# Patient Record
Sex: Female | Born: 1940 | Race: Black or African American | Hispanic: No | Smoking: Never smoker
Health system: Southern US, Community
[De-identification: ages and names within clinical notes are randomized; demographics above are authoritative.]

## PROBLEM LIST (undated history)

## (undated) DIAGNOSIS — E119 Type 2 diabetes mellitus without complications: Secondary | ICD-10-CM

## (undated) DIAGNOSIS — I10 Essential (primary) hypertension: Secondary | ICD-10-CM

## (undated) DIAGNOSIS — J189 Pneumonia, unspecified organism: Secondary | ICD-10-CM

## (undated) DIAGNOSIS — C541 Malignant neoplasm of endometrium: Secondary | ICD-10-CM

## (undated) DIAGNOSIS — Z923 Personal history of irradiation: Secondary | ICD-10-CM

## (undated) DIAGNOSIS — M1990 Unspecified osteoarthritis, unspecified site: Secondary | ICD-10-CM

## (undated) HISTORY — PX: APPENDECTOMY: SHX54

## (undated) HISTORY — PX: TUBAL LIGATION: SHX77

## (undated) HISTORY — DX: Malignant neoplasm of endometrium: C54.1

## (undated) HISTORY — DX: Essential (primary) hypertension: I10

## (undated) HISTORY — PX: ABDOMINAL HYSTERECTOMY: SHX81

## (undated) HISTORY — DX: Personal history of irradiation: Z92.3

## (undated) HISTORY — DX: Unspecified osteoarthritis, unspecified site: M19.90

## (undated) HISTORY — DX: Type 2 diabetes mellitus without complications: E11.9

---

## 2012-04-29 ENCOUNTER — Encounter (INDEPENDENT_AMBULATORY_CARE_PROVIDER_SITE_OTHER): Payer: Self-pay | Admitting: *Deleted

## 2012-04-30 ENCOUNTER — Encounter (INDEPENDENT_AMBULATORY_CARE_PROVIDER_SITE_OTHER): Payer: Self-pay

## 2013-10-27 ENCOUNTER — Encounter (INDEPENDENT_AMBULATORY_CARE_PROVIDER_SITE_OTHER): Payer: Self-pay | Admitting: *Deleted

## 2013-11-07 ENCOUNTER — Ambulatory Visit (INDEPENDENT_AMBULATORY_CARE_PROVIDER_SITE_OTHER): Payer: Self-pay | Admitting: Internal Medicine

## 2013-11-16 ENCOUNTER — Encounter (INDEPENDENT_AMBULATORY_CARE_PROVIDER_SITE_OTHER): Payer: Self-pay | Admitting: Internal Medicine

## 2013-11-16 ENCOUNTER — Ambulatory Visit (INDEPENDENT_AMBULATORY_CARE_PROVIDER_SITE_OTHER): Payer: PRIVATE HEALTH INSURANCE | Admitting: Internal Medicine

## 2013-11-16 ENCOUNTER — Other Ambulatory Visit (INDEPENDENT_AMBULATORY_CARE_PROVIDER_SITE_OTHER): Payer: Self-pay | Admitting: *Deleted

## 2013-11-16 ENCOUNTER — Telehealth (INDEPENDENT_AMBULATORY_CARE_PROVIDER_SITE_OTHER): Payer: Self-pay | Admitting: *Deleted

## 2013-11-16 VITALS — BP 120/58 | HR 72 | Temp 98.0°F | Ht 61.0 in | Wt 137.5 lb

## 2013-11-16 DIAGNOSIS — E119 Type 2 diabetes mellitus without complications: Secondary | ICD-10-CM

## 2013-11-16 DIAGNOSIS — R195 Other fecal abnormalities: Secondary | ICD-10-CM

## 2013-11-16 DIAGNOSIS — Z1211 Encounter for screening for malignant neoplasm of colon: Secondary | ICD-10-CM

## 2013-11-16 DIAGNOSIS — I10 Essential (primary) hypertension: Secondary | ICD-10-CM | POA: Insufficient documentation

## 2013-11-16 DIAGNOSIS — K921 Melena: Secondary | ICD-10-CM

## 2013-11-16 MED ORDER — PEG-KCL-NACL-NASULF-NA ASC-C 100 G PO SOLR: 1.0000 | Freq: Once | ORAL | Status: DC

## 2013-11-16 NOTE — Progress Notes (Signed)
Subjective:     Patient ID: Kristen Mccoy, female   DOB: 08-18-1941, 73 y.o.   MRN: [id number]  HPI Referred to our office for blood in stool. Patient denies seeing any blood in her stool.  10/12/2013 H and H 12.8 and 39.9, MCV 82, Platelet ct 240. Patient has never undergone a colonoscopy in the past.  Appetite is good. No weight loss. No dysphagia.  No abdominal pain. She usually has a BM daily. No melena or bright red rectal bleeding.   Review of Systems     Past Medical History  Diagnosis Date  . Diabetes     x 4-5 yrs  . Hypertension     x 5 yr   Past Medical History  Diagnosis Date  . Diabetes     x 4-5 yrs  . Hypertension     x 5 yr    History reviewed. No pertinent past surgical history.  Allergies  Allergen Reactions  . Aspirin   . Penicillins     No current outpatient prescriptions on file prior to visit.   No current facility-administered medications on file prior to visit.   No current outpatient prescriptions on file prior to visit.   No current facility-administered medications on file prior to visit.     Current outpatient prescriptions:amLODipine (NORVASC) 5 MG tablet, Take 5 mg by mouth daily., Disp: , Rfl: ;  lisinopril (PRINIVIL,ZESTRIL) 40 MG tablet, Take 40 mg by mouth daily., Disp: , Rfl: ;  metFORMIN (GLUCOPHAGE) 1000 MG tablet, Take 1,000 mg by mouth 2 (two) times daily with a meal., Disp: , Rfl: ;  vitamin B-12 (CYANOCOBALAMIN) 100 MCG tablet, Take 100 mcg by mouth daily., Disp: , Rfl:  Vitamin D, Ergocalciferol, (DRISDOL) 50000 UNITS CAPS capsule, Take 50,000 Units by mouth every 7 (seven) days., Disp: , Rfl:    History reviewed. No pertinent past surgical history.  Allergies  Allergen Reactions  . Aspirin   . Penicillins     No current outpatient prescriptions on file prior to visit.   No current facility-administered medications on file prior to visit.     Objective:   Physical Exam  Filed Vitals:   11/16/13 1053   BP: 120/58  Pulse: 72  Temp: 98 F (36.7 C)  Height: 5\' 1"  (1.549 m)  Weight: 137 lb 8 oz (62.37 kg)   Alert and oriented. Skin warm and dry. Oral mucosa is moist.   . Sclera anicteric, conjunctivae is pink. Thyroid not enlarged. No cervical lymphadenopathy. Lungs clear. Heart regular rate and rhythm.  Abdomen is soft. Bowel sounds are positive. No hepatomegaly. No abdominal masses felt. No tenderness.  No edema to lower extremities.       Assessment:    Guaiac postive stool. Colonic neoplasm needs to be ruled out. Has never undergone a colonoscopy in the past.    Plan:     Colonoscopy with Dr. Laural Golden.

## 2013-11-16 NOTE — Patient Instructions (Signed)
Colonoscopy with Dr. Rehman. The risks and benefits such as perforation, bleeding, and infection were reviewed with the patient and is agreeable. 

## 2013-11-16 NOTE — Telephone Encounter (Signed)
Patient needs movi prep 

## 2013-11-22 ENCOUNTER — Other Ambulatory Visit: Payer: Self-pay | Admitting: Internal Medicine

## 2013-11-22 DIAGNOSIS — R921 Mammographic calcification found on diagnostic imaging of breast: Secondary | ICD-10-CM

## 2013-11-28 ENCOUNTER — Encounter (HOSPITAL_COMMUNITY): Payer: Self-pay | Admitting: Pharmacy Technician

## 2013-11-30 ENCOUNTER — Other Ambulatory Visit (HOSPITAL_COMMUNITY): Payer: Self-pay | Admitting: Diagnostic Radiology

## 2013-11-30 ENCOUNTER — Ambulatory Visit
Admission: RE | Admit: 2013-11-30 | Discharge: 2013-11-30 | Disposition: A | Discharge status: Home / Self Care | Payer: PRIVATE HEALTH INSURANCE | Source: Ambulatory Visit | Attending: Internal Medicine | Admitting: Internal Medicine

## 2013-11-30 DIAGNOSIS — R921 Mammographic calcification found on diagnostic imaging of breast: Secondary | ICD-10-CM

## 2013-11-30 HISTORY — PX: BREAST BIOPSY: SHX20

## 2013-12-14 ENCOUNTER — Encounter (HOSPITAL_COMMUNITY): Payer: Self-pay | Admitting: *Deleted

## 2013-12-14 ENCOUNTER — Ambulatory Visit (HOSPITAL_COMMUNITY)
Admission: RE | Admit: 2013-12-14 | Discharge: 2013-12-14 | Disposition: A | Discharge status: Home / Self Care | Payer: PRIVATE HEALTH INSURANCE | Source: Ambulatory Visit | Attending: Internal Medicine | Admitting: Internal Medicine

## 2013-12-14 ENCOUNTER — Encounter (HOSPITAL_COMMUNITY)
Admission: RE | Disposition: A | Discharge status: Home / Self Care | Payer: Self-pay | Source: Ambulatory Visit | Attending: Internal Medicine

## 2013-12-14 DIAGNOSIS — R195 Other fecal abnormalities: Secondary | ICD-10-CM

## 2013-12-14 DIAGNOSIS — E119 Type 2 diabetes mellitus without complications: Secondary | ICD-10-CM | POA: Insufficient documentation

## 2013-12-14 DIAGNOSIS — K921 Melena: Secondary | ICD-10-CM | POA: Insufficient documentation

## 2013-12-14 DIAGNOSIS — D126 Benign neoplasm of colon, unspecified: Secondary | ICD-10-CM | POA: Insufficient documentation

## 2013-12-14 DIAGNOSIS — K644 Residual hemorrhoidal skin tags: Secondary | ICD-10-CM

## 2013-12-14 DIAGNOSIS — Z01812 Encounter for preprocedural laboratory examination: Secondary | ICD-10-CM | POA: Insufficient documentation

## 2013-12-14 DIAGNOSIS — I10 Essential (primary) hypertension: Secondary | ICD-10-CM | POA: Insufficient documentation

## 2013-12-14 DIAGNOSIS — Z79899 Other long term (current) drug therapy: Secondary | ICD-10-CM | POA: Insufficient documentation

## 2013-12-14 HISTORY — PX: COLONOSCOPY: SHX5424

## 2013-12-14 LAB — GLUCOSE, CAPILLARY: Glucose-Capillary: 103 mg/dL — ABNORMAL HIGH (ref 70–99)

## 2013-12-14 SURGERY — COLONOSCOPY
Anesthesia: Moderate Sedation

## 2013-12-14 MED ORDER — MEPERIDINE HCL 50 MG/ML IJ SOLN
INTRAMUSCULAR | Status: DC | PRN
  Administered 2013-12-14 (×2): 25 mg

## 2013-12-14 MED ORDER — STERILE WATER FOR IRRIGATION IR SOLN
Status: DC | PRN
  Administered 2013-12-14: 13:00:00

## 2013-12-14 MED ORDER — SODIUM CHLORIDE 0.9 % IV SOLN
INTRAVENOUS | Status: DC
  Administered 2013-12-14: 12:00:00 via INTRAVENOUS

## 2013-12-14 MED ORDER — MEPERIDINE HCL 50 MG/ML IJ SOLN
INTRAMUSCULAR | Status: AC
  Filled 2013-12-14: qty 1

## 2013-12-14 MED ORDER — MIDAZOLAM HCL 5 MG/5ML IJ SOLN
INTRAMUSCULAR | Status: DC | PRN
  Administered 2013-12-14: 2 mg via INTRAVENOUS
  Administered 2013-12-14: 1 mg via INTRAVENOUS
  Administered 2013-12-14: 2 mg via INTRAVENOUS

## 2013-12-14 MED ORDER — MIDAZOLAM HCL 5 MG/5ML IJ SOLN
INTRAMUSCULAR | Status: AC
  Filled 2013-12-14: qty 10

## 2013-12-14 NOTE — H&P (Addendum)
Kristen Mccoy is an 73 y.o. female.   Chief Complaint: Patient is here for colonoscopy. HPI: Patient is 73 year old African female who is undergoing diagnostic colonoscopy. She was found heme positive stool on routine testing. She denies abdominal pain change in her bowel habits melena or rectal bleeding. She does not take NSAIDs. She has good appetite her weight is stable. She did have CBC and her H&H was normal. Family history is negative for CRC.  Past Medical History  Diagnosis Date  . Diabetes     x 4-5 yrs  . Hypertension     x 5 yr    Past Surgical History  Procedure Laterality Date  . Appendectomy      Family History  Problem Relation Age of Onset  . Colon cancer Neg Hx    Social History:  reports that she has never smoked. She does not have any smokeless tobacco history on file. She reports that she does not drink alcohol or use illicit drugs.  Allergies:  Allergies  Allergen Reactions  . Aspirin   . Penicillins     Medications Prior to Admission  Medication Sig Dispense Refill  . amLODipine (NORVASC) 5 MG tablet Take 5 mg by mouth daily.      Marland Kitchen lisinopril (PRINIVIL,ZESTRIL) 40 MG tablet Take 40 mg by mouth daily.      . metFORMIN (GLUCOPHAGE) 1000 MG tablet Take 1,000 mg by mouth 2 (two) times daily with a meal.      . peg 3350 powder (MOVIPREP) 100 G SOLR Take 1 kit (200 g total) by mouth once.  1 kit  0  . vitamin B-12 (CYANOCOBALAMIN) 100 MCG tablet Take 100 mcg by mouth daily.      . Vitamin D, Ergocalciferol, (DRISDOL) 50000 UNITS CAPS capsule Take 50,000 Units by mouth every 7 (seven) days.        Results for orders placed during the hospital encounter of 12/14/13 (from the past 48 hour(s))  GLUCOSE, CAPILLARY     Status: Abnormal   Collection Time    12/14/13 12:08 PM      Result Value Ref Range   Glucose-Capillary 103 (*) 70 - 99 mg/dL   Comment 1 Documented in Chart     No results found.  ROS  Blood pressure 164/78, pulse 99, temperature  97.4 F (36.3 C), temperature source Oral, resp. rate 20, height _0  (1.549 m), weight 134 lb (60.782 kg), SpO2 100.00%. Physical Exam  Constitutional: She appears well-developed and well-nourished.  HENT:  Mouth/Throat: Oropharynx is clear and moist.  Eyes: Conjunctivae are normal. No scleral icterus.  Neck: No thyromegaly present.  Cardiovascular: Normal rate and regular rhythm.   Murmur (faint SEM at [facility].) heard. Respiratory: Effort normal and breath sounds normal.  GI: Soft. Bowel sounds are normal. She exhibits no distension and no mass. There is no tenderness.  Musculoskeletal: She exhibits no edema.  Lymphadenopathy:    She has no cervical adenopathy.  Neurological: She is alert.  Skin: Skin is warm and dry.     Assessment/Plan Heme positive stool. Diagnostic colonoscopy.  Wealthy Danielski U 12/14/2013, 12:57 PM

## 2013-12-14 NOTE — Discharge Instructions (Signed)
No aspirin or NSAIDs for 1 week. Resume usual diet and medications. No driving for 24 hours. Physician will contact you with biopsy results. Remember you cannot have an MRI until clip has passed Colonoscopy A colonoscopy is an exam to look at the entire large intestine (colon). This exam can help find problems such as tumors, polyps, inflammation, and areas of bleeding. The exam takes about 1 hour.  LET [facility] CARE PROVIDER KNOW ABOUT:   Any allergies you have.  All medicines you are taking, including vitamins, herbs, eye drops, creams, and over-the-counter medicines.  Previous problems you or members of your family have had with the use of anesthetics.  Any blood disorders you have.  Previous surgeries you have had.  Medical conditions you have. RISKS AND COMPLICATIONS  Generally, this is a safe procedure. However, as with any procedure, complications can occur. Possible complications include:  Bleeding.  Tearing or rupture of the colon wall.  Reaction to medicines given during the exam.  Infection (rare). BEFORE THE PROCEDURE   Ask your health care provider about changing or stopping your regular medicines.  You may be prescribed an oral bowel prep. This involves drinking a large amount of medicated liquid, starting the day before your procedure. The liquid will cause you to have multiple loose stools until your stool is almost clear or light green. This cleans out your colon in preparation for the procedure.  Do not eat or drink anything else once you have started the bowel prep, unless your health care provider tells you it is safe to do so.  Arrange for someone to drive you home after the procedure. PROCEDURE   You will be given medicine to help you relax (sedative).  You will lie on your side with your knees bent.  A long, flexible tube with a light and camera on the end (colonoscope) will be inserted through the rectum and into the colon. The camera sends  video back to a computer screen as it moves through the colon. The colonoscope also releases carbon dioxide gas to inflate the colon. This helps your health care provider see the area better.  During the exam, your health care provider may take a small tissue sample (biopsy) to be examined under a microscope if any abnormalities are found.  The exam is finished when the entire colon has been viewed. AFTER THE PROCEDURE   Do not drive for 24 hours after the exam.  You may have a small amount of blood in your stool.  You may pass moderate amounts of gas and have mild abdominal cramping or bloating. This is caused by the gas used to inflate your colon during the exam.  Ask when your test results will be ready and how you will get your results. Make sure you get your test results. Document Released: 09/05/2000 Document Revised: 06/29/2013 Document Reviewed: 05/16/2013 [facility] Patient Information 2014 [place]. Colon Polyps Polyps are lumps of extra tissue growing inside the body. Polyps can grow in the large intestine (colon). Most colon polyps are noncancerous (benign). However, some colon polyps can become cancerous over time. Polyps that are larger than a pea may be harmful. To be safe, caregivers remove and test all polyps. CAUSES  Polyps form when mutations in the genes cause your cells to grow and divide even though no more tissue is needed. RISK FACTORS There are a number of risk factors that can increase your chances of getting colon polyps. They include:  Being older than 8  years.  Family history of colon polyps or colon cancer.  Long-term colon diseases, such as colitis or Crohn disease.  Being overweight.  Smoking.  Being inactive.  Drinking too much alcohol. SYMPTOMS  Most small polyps do not cause symptoms. If symptoms are present, they may include:  Blood in the stool. The stool may look dark red or black.  Constipation or diarrhea that lasts longer than 1  week. DIAGNOSIS People often do not know they have polyps until their caregiver finds them during a regular checkup. Your caregiver can use 4 tests to check for polyps:  Digital rectal exam. The caregiver wears gloves and feels inside the rectum. This test would find polyps only in the rectum.  Barium enema. The caregiver puts a liquid called barium into your rectum before taking X-rays of your colon. Barium makes your colon look white. Polyps are dark, so they are easy to see in the X-ray pictures.  Sigmoidoscopy. A thin, flexible tube (sigmoidoscope) is placed into your rectum. The sigmoidoscope has a light and tiny camera in it. The caregiver uses the sigmoidoscope to look at the last third of your colon.  Colonoscopy. This test is like sigmoidoscopy, but the caregiver looks at the entire colon. This is the most common method for finding and removing polyps. TREATMENT  Any polyps will be removed during a sigmoidoscopy or colonoscopy. The polyps are then tested for cancer. PREVENTION  To help lower your risk of getting more colon polyps:  Eat plenty of fruits and vegetables. Avoid eating fatty foods.  Do not smoke.  Avoid drinking alcohol.  Exercise every day.  Lose weight if recommended by your caregiver.  Eat plenty of calcium and folate. Foods that are rich in calcium include milk, cheese, and broccoli. Foods that are rich in folate include chickpeas, kidney beans, and spinach. HOME CARE INSTRUCTIONS Keep all follow-up appointments as directed by your caregiver. You may need periodic exams to check for polyps. SEEK MEDICAL CARE IF: You notice bleeding during a bowel movement. Document Released: 06/04/2004 Document Revised: 12/01/2011 Document Reviewed: 11/18/2011 [facility] Patient Information 2014 [place].

## 2013-12-14 NOTE — Op Note (Addendum)
COLONOSCOPY PROCEDURE REPORT  PATIENT:  Kristen Mccoy  MR#:  [id number] Birthdate:  July 25, 1941, 73 y.o., female Endoscopist:  Dr. Rogene Houston, MD Referred By:  Dr. Monico Blitz, MD Procedure Date: 12/14/2013  Procedure:   Colonoscopy with snare polypectomy and application of single clip.  Indications:  Patient is 73 year old African female who is undergoing diagnostic colonoscopy because of heme positive stools. She has no GI symptoms. Her H&H is normal. She does not take NSAIDs and family history is negative for CRC.  Informed Consent:  The procedure and risks were reviewed with the patient and informed consent was obtained.  Medications:  Demerol 50 mg IV Versed 5 mg IV  Description of procedure:  After a digital rectal exam was performed, that colonoscope was advanced from the anus through the rectum and colon to the area of the cecum, ileocecal valve and appendiceal orifice. The cecum was deeply intubated. These structures were well-seen and photographed for the record. From the level of the cecum and ileocecal valve, the scope was slowly and cautiously withdrawn. The mucosal surfaces were carefully surveyed utilizing scope tip to flexion to facilitate fold flattening as needed. The scope was pulled down into the rectum where a thorough exam including retroflexion was performed.  Findings:   Prep satisfactory. 6 mm polyp snared from transverse colon. Bleeding from polypectomy site could not be controlled with coagulation and therefore single instinct clip applied with hemostasis. Mucosa rest of the colon and rectum was normal. Small hemorrhoids below the dentate line.   Therapeutic/Diagnostic Maneuvers Performed:  See above  Complications:  None  Cecal Withdrawal Time:  20 minutes  Impression:  Examination performed to cecum. 6 mm polyp snared from transverse colon. Single instinct clip applied to polypectomy site to control bleeding. External  hemorrhoids.  Recommendations:  Standard instructions given. Patient informed that she cannot have an MRI until clip has passed. I will contact patient with biopsy results and further recommendations.  Kristen Mccoy  12/14/2013 1:46 PM  CC: Dr. Monico Blitz, MD & Dr. Rayne Du ref. provider found

## 2013-12-20 ENCOUNTER — Encounter (HOSPITAL_COMMUNITY): Payer: Self-pay | Admitting: Internal Medicine

## 2013-12-26 ENCOUNTER — Encounter (INDEPENDENT_AMBULATORY_CARE_PROVIDER_SITE_OTHER): Payer: Self-pay | Admitting: *Deleted

## 2014-03-16 ENCOUNTER — Encounter (INDEPENDENT_AMBULATORY_CARE_PROVIDER_SITE_OTHER): Payer: Self-pay

## 2015-12-03 ENCOUNTER — Encounter (INDEPENDENT_AMBULATORY_CARE_PROVIDER_SITE_OTHER): Payer: Self-pay | Admitting: *Deleted

## 2015-12-04 ENCOUNTER — Ambulatory Visit (INDEPENDENT_AMBULATORY_CARE_PROVIDER_SITE_OTHER): Payer: Medicare Other | Admitting: Internal Medicine

## 2015-12-04 ENCOUNTER — Other Ambulatory Visit (INDEPENDENT_AMBULATORY_CARE_PROVIDER_SITE_OTHER): Payer: Self-pay | Admitting: Internal Medicine

## 2015-12-04 ENCOUNTER — Encounter (INDEPENDENT_AMBULATORY_CARE_PROVIDER_SITE_OTHER): Payer: Self-pay

## 2015-12-04 ENCOUNTER — Encounter (INDEPENDENT_AMBULATORY_CARE_PROVIDER_SITE_OTHER): Payer: Self-pay | Admitting: Internal Medicine

## 2015-12-04 ENCOUNTER — Encounter (INDEPENDENT_AMBULATORY_CARE_PROVIDER_SITE_OTHER): Payer: Self-pay | Admitting: *Deleted

## 2015-12-04 VITALS — BP 160/82 | HR 76 | Temp 98.0°F | Ht 61.0 in | Wt 131.7 lb

## 2015-12-04 DIAGNOSIS — K859 Acute pancreatitis without necrosis or infection, unspecified: Secondary | ICD-10-CM | POA: Diagnosis not present

## 2015-12-04 DIAGNOSIS — R1013 Epigastric pain: Secondary | ICD-10-CM

## 2015-12-04 DIAGNOSIS — R112 Nausea with vomiting, unspecified: Secondary | ICD-10-CM

## 2015-12-04 NOTE — Progress Notes (Addendum)
Subjective:    Patient ID: Kristen Mccoy, female    DOB: 1941/09/03, 75 y.o.   MRN: [id number]  HPI Referred by Dr Manuella Ghazi for epigastric pain and nausea and vomiting/possible Mallory Weiss tear.  She has been seen at [facility] ED about 2 months ago for same.  She tells me the nausea has continued.  She has not vomited since last week.  She says what she threw up was bitter. The vomiting usually occurs in the morning. She says she saw blood last week after vomiting. She says the vomiting occurs every other week and is always in the morning when she gets up after drinking water. She denies any acid reflux. She has been taking Protonix which helps with the vomiting. Her appetite is not good. She has lost 5 pounds since her last visit in 2015. She denies any abdominal pain.  She usually has a BM daily. She is avoiding greasy foods.  Greasy foods however do not bother her. No dysphagia.  No NSAIDs.    [facility] notes were received after patient was seen in the office) 10/28/2015 Upper abdominal pain, vomiting  2 days.  CT scan revealed  Inflammatory changes around the pancreatitic tail consistent with acute pancreatitis.  No pseudocyst formation.  10/28/2015 Glucose 177, Albumin 4.1, ALP 42, AST 13.6, total bili 0.3, ALT 17.  H and H 12.2 and 38.6, WBC 12.2, MCV 78.8    12/13/2013: Colonoscopy with snare polypectomy and application of single clip.  Indications: Patient is 75 year old African female who is undergoing diagnostic colonoscopy because of heme positive stools. She has no GI symptoms. Her H&H is normal. She does not take NSAIDs and family history is negative for CRC.  Impression:  Examination performed to cecum. 6 mm polyp snared from transverse colon. Single instinct clip applied to polypectomy site to control bleeding. External hemorrhoids.  Notes Recorded by Rogene Houston, MD on 12/25/2013 at 7:38 PM Patient had single small polyp snared from transverse colon and it is tubular  adenoma Results reviewed with patient   Review of Systems Past Medical History  Diagnosis Date  . Diabetes ([place])     x 4-5 yrs  . Hypertension     x 5 yr    Past Surgical History  Procedure Laterality Date  . Appendectomy    . Colonoscopy N/A 12/14/2013    Procedure: COLONOSCOPY;  Surgeon: Rogene Houston, MD;  Location: [facility];  Service: Endoscopy;  Laterality: N/A;  100    Allergies  Allergen Reactions  . Aspirin   . Penicillins     Current Outpatient Prescriptions on File Prior to Visit  Medication Sig Dispense Refill  . lisinopril (PRINIVIL,ZESTRIL) 40 MG tablet Take 40 mg by mouth daily.    . metFORMIN (GLUCOPHAGE) 1000 MG tablet Take 1,000 mg by mouth 2 (two) times daily with a meal.    . vitamin B-12 (CYANOCOBALAMIN) 100 MCG tablet Take 100 mcg by mouth daily.    . Vitamin D, Ergocalciferol, (DRISDOL) 50000 UNITS CAPS capsule Take 50,000 Units by mouth every 7 (seven) days.     No current facility-administered medications on file prior to visit.        Objective:   Physical Exam Blood pressure 160/82, pulse 76, temperature 98 F (36.7 C), height 5\' 1"  (1.549 m), weight 131 lb 11.2 oz (59.739 kg). Alert and oriented. Skin warm and dry. Oral mucosa is moist.   . Sclera anicteric, conjunctivae is pink. Thyroid not enlarged. No cervical  lymphadenopathy. Lungs clear. Heart regular rate and rhythm.  Abdomen is soft. Bowel sounds are positive. No hepatomegaly. No abdominal masses felt. No tenderness.  No edema to lower extremities.          Assessment & Plan:  Nausea and vomiting. Epigastric tenderness. PUD needs to be ruled out.  Continue the Protonix. EGD. Will need a follow CT to be sure her pancreatitis has resolved.

## 2015-12-04 NOTE — Patient Instructions (Addendum)
EGD. The risks and benefits such as perforation, bleeding, and infection were reviewed with the patient and is agreeable. Am going to order a CT abdomen/pelvis with CM for her recent diagnosis of pancreatitis.

## 2015-12-04 NOTE — Addendum Note (Signed)
Addended by: Butch Penny on: 12/04/2015 12:35 PM   Modules accepted: Orders

## 2015-12-11 ENCOUNTER — Ambulatory Visit (HOSPITAL_COMMUNITY)
Admission: RE | Admit: 2015-12-11 | Discharge: 2015-12-11 | Disposition: A | Discharge status: Home / Self Care | Payer: Medicare Other | Source: Ambulatory Visit | Attending: Internal Medicine | Admitting: Internal Medicine

## 2015-12-11 DIAGNOSIS — K859 Acute pancreatitis without necrosis or infection, unspecified: Secondary | ICD-10-CM | POA: Diagnosis present

## 2015-12-11 DIAGNOSIS — K760 Fatty (change of) liver, not elsewhere classified: Secondary | ICD-10-CM | POA: Diagnosis not present

## 2015-12-11 DIAGNOSIS — R1013 Epigastric pain: Secondary | ICD-10-CM | POA: Insufficient documentation

## 2015-12-11 DIAGNOSIS — R112 Nausea with vomiting, unspecified: Secondary | ICD-10-CM | POA: Diagnosis present

## 2015-12-11 DIAGNOSIS — Z87898 Personal history of other specified conditions: Secondary | ICD-10-CM | POA: Insufficient documentation

## 2015-12-11 LAB — POCT I-STAT CREATININE: Creatinine, Ser: 0.6 mg/dL (ref 0.44–1.00)

## 2015-12-11 MED ORDER — IOHEXOL 300 MG/ML  SOLN
100.0000 mL | Freq: Once | INTRAMUSCULAR | Status: AC | PRN
  Administered 2015-12-11: 100 mL via INTRAVENOUS

## 2015-12-20 ENCOUNTER — Telehealth (INDEPENDENT_AMBULATORY_CARE_PROVIDER_SITE_OTHER): Payer: Self-pay | Admitting: Internal Medicine

## 2015-12-20 NOTE — Telephone Encounter (Signed)
Ms. Kristen Mccoy called saying saying she was told by Dr. Laural Golden to call back in a few days with an update on her symptoms. She feels ok but when she eats she still feels nauseous everyday. It doesn't last long usually. She's no longer vomiting after eating.   Pt's ph# [phone] Thank you.

## 2015-12-21 NOTE — Telephone Encounter (Signed)
Progress report from Kristen Mccoy.

## 2015-12-25 NOTE — Telephone Encounter (Signed)
She was scheduled for EGD tomorrow. This message must be for Kristen Mccoy

## 2015-12-25 NOTE — Telephone Encounter (Signed)
noted 

## 2015-12-26 ENCOUNTER — Ambulatory Visit (HOSPITAL_COMMUNITY)
Admission: RE | Admit: 2015-12-26 | Discharge: 2015-12-26 | Disposition: A | Discharge status: Home / Self Care | Payer: Medicare Other | Source: Ambulatory Visit | Attending: Internal Medicine | Admitting: Internal Medicine

## 2015-12-26 ENCOUNTER — Encounter (HOSPITAL_COMMUNITY): Payer: Self-pay | Admitting: *Deleted

## 2015-12-26 ENCOUNTER — Encounter (HOSPITAL_COMMUNITY)
Admission: RE | Disposition: A | Discharge status: Home / Self Care | Payer: Self-pay | Source: Ambulatory Visit | Attending: Internal Medicine

## 2015-12-26 DIAGNOSIS — I10 Essential (primary) hypertension: Secondary | ICD-10-CM | POA: Diagnosis not present

## 2015-12-26 DIAGNOSIS — Z7984 Long term (current) use of oral hypoglycemic drugs: Secondary | ICD-10-CM | POA: Diagnosis not present

## 2015-12-26 DIAGNOSIS — K2970 Gastritis, unspecified, without bleeding: Secondary | ICD-10-CM | POA: Insufficient documentation

## 2015-12-26 DIAGNOSIS — K2960 Other gastritis without bleeding: Secondary | ICD-10-CM | POA: Diagnosis not present

## 2015-12-26 DIAGNOSIS — Z79899 Other long term (current) drug therapy: Secondary | ICD-10-CM | POA: Diagnosis not present

## 2015-12-26 DIAGNOSIS — E119 Type 2 diabetes mellitus without complications: Secondary | ICD-10-CM | POA: Insufficient documentation

## 2015-12-26 DIAGNOSIS — K319 Disease of stomach and duodenum, unspecified: Secondary | ICD-10-CM | POA: Diagnosis not present

## 2015-12-26 DIAGNOSIS — Z880 Allergy status to penicillin: Secondary | ICD-10-CM | POA: Insufficient documentation

## 2015-12-26 DIAGNOSIS — R1013 Epigastric pain: Secondary | ICD-10-CM | POA: Insufficient documentation

## 2015-12-26 DIAGNOSIS — K920 Hematemesis: Secondary | ICD-10-CM | POA: Insufficient documentation

## 2015-12-26 DIAGNOSIS — R112 Nausea with vomiting, unspecified: Secondary | ICD-10-CM

## 2015-12-26 HISTORY — PX: ESOPHAGOGASTRODUODENOSCOPY: SHX5428

## 2015-12-26 LAB — GLUCOSE, CAPILLARY
GLUCOSE-CAPILLARY: 79 mg/dL (ref 65–99)
GLUCOSE-CAPILLARY: 242 mg/dL — AB (ref 65–99)

## 2015-12-26 SURGERY — EGD (ESOPHAGOGASTRODUODENOSCOPY)
Anesthesia: Moderate Sedation

## 2015-12-26 MED ORDER — SODIUM CHLORIDE 0.9 % IV SOLN: INTRAVENOUS | Status: DC

## 2015-12-26 MED ORDER — BUTAMBEN-TETRACAINE-BENZOCAINE 2-2-14 % EX AERO
INHALATION_SPRAY | CUTANEOUS | Status: DC | PRN
  Administered 2015-12-26: 2 via TOPICAL

## 2015-12-26 MED ORDER — DEXTROSE-NACL 5-0.9 % IV SOLN
INTRAVENOUS | Status: DC
  Administered 2015-12-26: 13:00:00 via INTRAVENOUS

## 2015-12-26 MED ORDER — MIDAZOLAM HCL 5 MG/5ML IJ SOLN
INTRAMUSCULAR | Status: AC
  Filled 2015-12-26: qty 10

## 2015-12-26 MED ORDER — MIDAZOLAM HCL 5 MG/5ML IJ SOLN
INTRAMUSCULAR | Status: DC | PRN
  Administered 2015-12-26: 1 mg via INTRAVENOUS
  Administered 2015-12-26 (×2): 2 mg via INTRAVENOUS

## 2015-12-26 MED ORDER — STERILE WATER FOR IRRIGATION IR SOLN
Status: DC | PRN
  Administered 2015-12-26: 2.5 mL

## 2015-12-26 MED ORDER — MEPERIDINE HCL 50 MG/ML IJ SOLN
INTRAMUSCULAR | Status: DC | PRN
  Administered 2015-12-26 (×2): 25 mg via INTRAVENOUS

## 2015-12-26 MED ORDER — MEPERIDINE HCL 50 MG/ML IJ SOLN
INTRAMUSCULAR | Status: AC
  Filled 2015-12-26: qty 1

## 2015-12-26 NOTE — H&P (Signed)
Kristen Mccoy is an 75 y.o. female.   Chief Complaint: Patient is here for esophagogastroduodenoscopy. HPI: Patient is 75 year old Afro-American female who presents with few week history of intermittent nausea vomiting and epigastric pain. She also had single episode of hematemesis when she vomited small amount of bright red blood per rectum. She denies melena or rectal bleeding. She is on Protonix. She states she is feeling better and has an episode in 2 weeks. She has lost 5 pounds. She denies heartburn dysphagia. History is negative for peptic ulcer disease. She does not take OTC NSAIDs. She does not smoke cigarettes or drink alcohol.  Past Medical History  Diagnosis Date  . Diabetes ([place])     x 4-5 yrs  . Hypertension     x 5 yr    Past Surgical History  Procedure Laterality Date  . Appendectomy    . Colonoscopy N/A 12/14/2013    Procedure: COLONOSCOPY;  Surgeon: Rogene Houston, MD;  Location: [facility];  Service: Endoscopy;  Laterality: N/A;  100    Family History  Problem Relation Age of Onset  . Colon cancer Neg Hx    Social History:  reports that she has never smoked. She does not have any smokeless tobacco history on file. She reports that she does not drink alcohol or use illicit drugs.  Allergies:  Allergies  Allergen Reactions  . Penicillins Other (See Comments)    MOUTH WENT SIDEWAYS.  Marland Kitchen Aspirin Itching and Rash    Medications Prior to Admission  Medication Sig Dispense Refill  . atorvastatin (LIPITOR) 10 MG tablet Take 10 mg by mouth daily.    Marland Kitchen lisinopril (PRINIVIL,ZESTRIL) 20 MG tablet Take 20 mg by mouth daily.    . metFORMIN (GLUCOPHAGE) 1000 MG tablet Take 1,000 mg by mouth 2 (two) times daily with a meal.    . pantoprazole (PROTONIX) 40 MG tablet Take 40 mg by mouth daily.    . sitaGLIPtin (JANUVIA) 50 MG tablet Take 50 mg by mouth daily.    . vitamin B-12 (CYANOCOBALAMIN) 100 MCG tablet Take 100 mcg by mouth daily.    . Vitamin D, Ergocalciferol,  (DRISDOL) 50000 UNITS CAPS capsule Take 50,000 Units by mouth every 7 (seven) days. Takes on Thursdays.      Results for orders placed or performed during the hospital encounter of 12/26/15 (from the past 48 hour(s))  Glucose, capillary     Status: None   Collection Time: 12/26/15 12:56 PM  Result Value Ref Range   Glucose-Capillary 79 65 - 99 mg/dL   No results found.  ROS  Blood pressure 199/87, pulse 68, resp. rate 18, height 5\' 1"  (1.549 m), weight 143 lb (64.864 kg), SpO2 99 %. Physical Exam  Constitutional: She appears well-developed and well-nourished.  HENT:  Mouth/Throat: Oropharynx is clear and moist.  Eyes: Conjunctivae are normal. No scleral icterus.  Neck: No thyromegaly present.  Cardiovascular: Normal rate, regular rhythm and normal heart sounds.   No murmur heard. Respiratory: Effort normal and breath sounds normal.  GI: Soft. She exhibits no distension. There is tenderness (mild midepigastric tenderness).  Musculoskeletal: She exhibits no edema.  Lymphadenopathy:    She has no cervical adenopathy.  Neurological: She is alert.  Skin: Skin is warm and dry.     Assessment/Plan History of nausea vomiting and epigastric pain and single episode of hematemesis. Diagnostic esophagogastroduodenoscopy.  Rogene Houston, MD 12/26/2015, 1:59 PM

## 2015-12-26 NOTE — Discharge Instructions (Signed)
Resume usual medications and diet. °No driving for 24 hours. °Physician will call with biopsy results. ° °Gastrointestinal Endoscopy, Care After °Refer to this sheet in the next few weeks. These instructions provide you with information on caring for yourself after your procedure. Your caregiver may also give you more specific instructions. Your treatment has been planned according to current medical practices, but problems sometimes occur. Call your caregiver if you have any problems or questions after your procedure. °HOME CARE INSTRUCTIONS °· If you were given medicine to help you relax (sedative), do not drive, operate machinery, or sign important documents for 24 hours. °· Avoid alcohol and hot or warm beverages for the first 24 hours after the procedure. °· Only take over-the-counter or prescription medicines for pain, discomfort, or fever as directed by your caregiver. You may resume taking your normal medicines unless your caregiver tells you otherwise. Ask your caregiver when you may resume taking medicines that may cause bleeding, such as aspirin, clopidogrel, or warfarin. °· You may return to your normal diet and activities on the day after your procedure, or as directed by your caregiver. Walking may help to reduce any bloated feeling in your abdomen. °· Drink enough fluids to keep your urine clear or pale yellow. °· You may gargle with salt water if you have a sore throat. °SEEK IMMEDIATE MEDICAL CARE IF: °· You have severe nausea or vomiting. °· You have severe abdominal pain, abdominal cramps that last longer than 6 hours, or abdominal swelling (distention). °· You have severe shoulder or back pain. °· You have trouble swallowing. °· You have shortness of breath, your breathing is shallow, or you are breathing faster than normal. °· You have a fever or a rapid heartbeat. °· You vomit blood or material that looks like coffee grounds. °· You have bloody, black, or tarry stools. °MAKE SURE  YOU: °· Understand these instructions. °· Will watch your condition. °· Will get help right away if you are not doing well or get worse. °  °This information is not intended to replace advice given to you by your health care provider. Make sure you discuss any questions you have with your health care provider. °  °Document Released: 04/22/2004 Document Revised: 09/29/2014 Document Reviewed: 12/09/2011 °Elsevier Interactive Patient Education ©2016 [employer] ° °

## 2015-12-26 NOTE — Op Note (Signed)
[facility] Patient Name: Kristen Mccoy Procedure Date: 12/26/2015 1:37 PM MRN: [id number] Date of Birth: [birth year] Attending MD: Hildred Laser , MD CSN: [id number] Age: 75 Admit Type: Outpatient Procedure:                Upper GI endoscopy Indications:              Epigastric abdominal pain, Hematemesis, Nausea with                            vomiting Providers:                Hildred Laser, MD, Lurline Del, RN, Janeece Riggers, RN Referring MD:             Fuller Canada. Manuella Ghazi, MD (Referring MD) Medicines:                Cetacaine spray, Meperidine 50 mg IV, Midazolam 5                            mg IV Complications:            No immediate complications. Estimated Blood Loss:     Estimated blood loss was minimal. Procedure:                Pre-Anesthesia Assessment:                           - Prior to the procedure, a History and Physical                            was performed, and patient medications and                            allergies were reviewed. The patient's tolerance of                            previous anesthesia was also reviewed. The risks                            and benefits of the procedure and the sedation                            options and risks were discussed with the patient.                            All questions were answered, and informed consent                            was obtained. Prior Anticoagulants: The patient has                            taken no previous anticoagulant or antiplatelet                            agents. ASA Grade Assessment: II - A patient with  mild systemic disease. After reviewing the risks                            and benefits, the patient was deemed in                            satisfactory condition to undergo the procedure.                           After obtaining informed consent, the endoscope was                            passed under direct vision. Throughout the              procedure, the patient's blood pressure, pulse, and                            oxygen saturations were monitored continuously. The                            [id number]) was introduced through the                            mouth, and advanced to the second part of duodenum.                            The upper GI endoscopy was technically difficult                            and complex due to the patient's body habitus.                            Successful completion of the procedure was aided by                            withdrawing the scope and replacing with the                            pediatric endoscope. The patient tolerated the                            procedure well. Scope In: 2:18:48 PM Scope Out: 2:32:37 PM Total Procedure Duration: 0 hours 13 minutes 49 seconds  Findings:      The examined esophagus was normal.      The Z-line was regular and was found 36 cm from the incisors.      The cardia, gastric fundus, gastric body and incisura were normal.      Diffuse mild inflammation characterized by congestion (edema) and       erythema was found in the gastric antrum. Biopsies were taken with a       cold forceps for histology.      The duodenal bulb and second portion of the duodenum were normal.      The hypopharynx was normal. Impression:               -  Normal esophagus.                           - Z-line regular, 36 cm from the incisors.                           - Normal cardia, gastric fundus, gastric body and                            incisura.                           - Non-erosive gastritis at antrum. Biopsied.                           - Normal duodenal bulb and second portion of the                            duodenum.                           - Normal hypopharynx. Moderate Sedation:      Moderate (conscious) sedation was administered by the endoscopy nurse       and supervised by the endoscopist. The following parameters were        monitored: oxygen saturation, heart rate, blood pressure, CO2       capnography and response to care. Total physician intraservice time was       23 minutes. Recommendation:           - Patient has a contact number available for                            emergencies. The signs and symptoms of potential                            delayed complications were discussed with the                            patient. Return to normal activities tomorrow.                            Written discharge instructions were provided to the                            patient.                           - Resume previous diet today.                           - Continue present medications.                           - Await pathology results. Procedure Code(s):        --- Professional ---  [id number], Esophagogastroduodenoscopy, flexible,                            transoral; with biopsy, single or multiple                           99152, Moderate sedation services provided by the                            same physician or other qualified health care                            professional performing the diagnostic or                            therapeutic service that the sedation supports,                            requiring the presence of an independent trained                            observer to assist in the monitoring of the                            patient's level of consciousness and physiological                            status; initial 15 minutes of intraservice time,                            patient age 54 years or older                           [phone], Moderate sedation services; each additional                            15 minutes intraservice time Diagnosis Code(s):        --- Professional ---                           K29.60, Other gastritis without bleeding                           R10.13, Epigastric pain                           K92.0, Hematemesis                            R11.2, Nausea with vomiting, unspecified CPT copyright 2016 [employer]. All rights reserved. The codes documented in this report are preliminary and upon coder review may  be revised to meet current compliance requirements. Hildred Laser, MD Hildred Laser, MD 12/26/2015 2:43:27 PM This report has been signed electronically. Number of Addenda: 0

## 2016-01-01 ENCOUNTER — Encounter (HOSPITAL_COMMUNITY): Payer: Self-pay | Admitting: Internal Medicine

## 2018-02-15 ENCOUNTER — Encounter (HOSPITAL_COMMUNITY): Payer: Self-pay | Admitting: Emergency Medicine

## 2018-02-15 ENCOUNTER — Emergency Department (HOSPITAL_COMMUNITY): Payer: Medicare HMO

## 2018-02-15 ENCOUNTER — Other Ambulatory Visit: Payer: Self-pay

## 2018-02-15 ENCOUNTER — Emergency Department (HOSPITAL_COMMUNITY)
Admission: EM | Admit: 2018-02-15 | Discharge: 2018-02-15 | Disposition: A | Discharge status: Home / Self Care | Payer: Medicare HMO | Attending: Emergency Medicine | Admitting: Emergency Medicine

## 2018-02-15 DIAGNOSIS — I10 Essential (primary) hypertension: Secondary | ICD-10-CM | POA: Diagnosis not present

## 2018-02-15 DIAGNOSIS — R1084 Generalized abdominal pain: Secondary | ICD-10-CM

## 2018-02-15 DIAGNOSIS — Z7984 Long term (current) use of oral hypoglycemic drugs: Secondary | ICD-10-CM | POA: Insufficient documentation

## 2018-02-15 DIAGNOSIS — E119 Type 2 diabetes mellitus without complications: Secondary | ICD-10-CM | POA: Insufficient documentation

## 2018-02-15 DIAGNOSIS — Z79899 Other long term (current) drug therapy: Secondary | ICD-10-CM | POA: Insufficient documentation

## 2018-02-15 DIAGNOSIS — R109 Unspecified abdominal pain: Secondary | ICD-10-CM | POA: Diagnosis present

## 2018-02-15 DIAGNOSIS — N9489 Other specified conditions associated with female genital organs and menstrual cycle: Secondary | ICD-10-CM

## 2018-02-15 LAB — CBC WITH DIFFERENTIAL/PLATELET
BASOS PCT: 0 %
Basophils Absolute: 0 10*3/uL (ref 0.0–0.1)
Eosinophils Absolute: 0.2 10*3/uL (ref 0.0–0.7)
Eosinophils Relative: 2 %
HEMATOCRIT: 35.5 % — AB (ref 36.0–46.0)
Hemoglobin: 11.1 g/dL — ABNORMAL LOW (ref 12.0–15.0)
LYMPHS ABS: 1.6 10*3/uL (ref 0.7–4.0)
Lymphocytes Relative: 25 %
MCH: 24.6 pg — ABNORMAL LOW (ref 26.0–34.0)
MCHC: 31.3 g/dL (ref 30.0–36.0)
MCV: 78.7 fL (ref 78.0–100.0)
MONOS PCT: 7 %
Monocytes Absolute: 0.5 10*3/uL (ref 0.1–1.0)
NEUTROS ABS: 4.4 10*3/uL (ref 1.7–7.7)
Neutrophils Relative %: 66 %
Platelets: 243 10*3/uL (ref 150–400)
RBC: 4.51 MIL/uL (ref 3.87–5.11)
RDW: 15.9 % — ABNORMAL HIGH (ref 11.5–15.5)
WBC: 6.6 10*3/uL (ref 4.0–10.5)

## 2018-02-15 LAB — URINALYSIS, ROUTINE W REFLEX MICROSCOPIC
BILIRUBIN URINE: NEGATIVE
Glucose, UA: NEGATIVE mg/dL
Hgb urine dipstick: NEGATIVE
Ketones, ur: NEGATIVE mg/dL
Leukocytes, UA: NEGATIVE
NITRITE: NEGATIVE
PH: 7 (ref 5.0–8.0)
Protein, ur: NEGATIVE mg/dL
SPECIFIC GRAVITY, URINE: 1.014 (ref 1.005–1.030)

## 2018-02-15 LAB — COMPREHENSIVE METABOLIC PANEL
ALBUMIN: 3.6 g/dL (ref 3.5–5.0)
ALT: 21 U/L (ref 14–54)
AST: 28 U/L (ref 15–41)
Alkaline Phosphatase: 41 U/L (ref 38–126)
Anion gap: 11 (ref 5–15)
BILIRUBIN TOTAL: 0.5 mg/dL (ref 0.3–1.2)
BUN: 15 mg/dL (ref 6–20)
CHLORIDE: 102 mmol/L (ref 101–111)
CO2: 29 mmol/L (ref 22–32)
Calcium: 9.3 mg/dL (ref 8.9–10.3)
Creatinine, Ser: 0.82 mg/dL (ref 0.44–1.00)
GFR calc Af Amer: >60 mL/min (ref >60)
Glucose, Bld: 114 mg/dL — ABNORMAL HIGH (ref 65–99)
POTASSIUM: 4.2 mmol/L (ref 3.5–5.1)
SODIUM: 142 mmol/L (ref 135–145)
Total Protein: 7.3 g/dL (ref 6.5–8.1)

## 2018-02-15 LAB — LIPASE, BLOOD: Lipase: 38 U/L (ref 11–51)

## 2018-02-15 MED ORDER — DICYCLOMINE HCL 20 MG PO TABS: 20.0000 mg | ORAL_TABLET | Freq: Four times a day (QID) | ORAL | 0 refills | Status: DC | PRN

## 2018-02-15 MED ORDER — IOPAMIDOL (ISOVUE-300) INJECTION 61%
100.0000 mL | Freq: Once | INTRAVENOUS | Status: AC | PRN
  Administered 2018-02-15: 100 mL via INTRAVENOUS

## 2018-02-15 MED ORDER — DICYCLOMINE HCL 10 MG/ML IM SOLN
20.0000 mg | Freq: Once | INTRAMUSCULAR | Status: AC
  Administered 2018-02-15: 20 mg via INTRAMUSCULAR
  Filled 2018-02-15: qty 2

## 2018-02-15 NOTE — ED Notes (Signed)
Pt transported to CT ?

## 2018-02-15 NOTE — ED Provider Notes (Signed)
[facility] EMERGENCY DEPARTMENT Provider Note   CSN: [id number] Arrival date & time: 02/15/18  3474     History   Chief Complaint Chief Complaint  Patient presents with  . Abdominal Pain    HPI Kristen Mccoy is a 77 y.o. female.  HPI  Pt was seen at 1000.  Per pt, c/o gradual onset and persistence of constant generalized abd "pain" for the past 5 to 6 weeks.  Has been associated with no other symptoms.  Describes the abd pain as "cramping."  Denies N/V, no diarrhea, no fevers, no back pain, no rash, no CP/SOB, no black or blood in stools, no dysuria/hematuria. Pt was evaluated at OSH for these symptoms and "wasn't given an answer."       Past Medical History:  Diagnosis Date  . Diabetes ([place])    x 4-5 yrs  . Hypertension    x 5 yr    Patient Active Problem List   Diagnosis Date Noted  . Diabetes ([place]) 11/16/2013  . Essential hypertension, benign 11/16/2013  . Blood in stool 11/16/2013    Past Surgical History:  Procedure Laterality Date  . APPENDECTOMY    . COLONOSCOPY N/A 12/14/2013   Procedure: COLONOSCOPY;  Surgeon: Rogene Houston, MD;  Location: [facility] ENDO SUITE;  Service: Endoscopy;  Laterality: N/A;  100  . ESOPHAGOGASTRODUODENOSCOPY N/A 12/26/2015   Procedure: ESOPHAGOGASTRODUODENOSCOPY (EGD);  Surgeon: Rogene Houston, MD;  Location: [facility] ENDO SUITE;  Service: Endoscopy;  Laterality: N/A;  2:45     OB History   None      Home Medications    Prior to Admission medications   Medication Sig Start Date End Date Taking? Authorizing Provider  amLODipine (NORVASC) 5 MG tablet Take 5 mg by mouth daily.   Yes [provider]  atorvastatin (LIPITOR) 20 MG tablet Take 20 mg by mouth daily.   Yes [provider]  hydrochlorothiazide (HYDRODIURIL) 25 MG tablet Take 25 mg by mouth daily.   Yes [provider]  metFORMIN (GLUCOPHAGE) 500 MG tablet Take 1,000 mg by mouth 2 (two) times daily with a meal.   Yes [provider]    sitaGLIPtin (JANUVIA) 50 MG tablet Take 50 mg by mouth daily.   Yes [provider]  Vitamin D, Ergocalciferol, (DRISDOL) 50000 UNITS CAPS capsule Take 50,000 Units by mouth every 7 (seven) days. Takes on Thursdays.   Yes [provider]    Family History Family History  Problem Relation Age of Onset  . Colon cancer Neg Hx     Social History Social History   Tobacco Use  . Smoking status: Never Smoker  . Smokeless tobacco: Never Used  Substance Use Topics  . Alcohol use: No  . Drug use: No     Allergies   Penicillins and Aspirin   Review of Systems Review of Systems ROS: Statement: All systems negative except as marked or noted in the HPI; Constitutional: Negative for fever and chills. ; ; Eyes: Negative for eye pain, redness and discharge. ; ; ENMT: Negative for ear pain, hoarseness, nasal congestion, sinus pressure and sore throat. ; ; Cardiovascular: Negative for chest pain, palpitations, diaphoresis, dyspnea and peripheral edema. ; ; Respiratory: Negative for cough, wheezing and stridor. ; ; Gastrointestinal: +abd pain. Negative for nausea, vomiting, diarrhea, blood in stool, hematemesis, jaundice and rectal bleeding. . ; ; Genitourinary: Negative for dysuria, flank pain and hematuria. ; ; Musculoskeletal: Negative for back pain and neck pain. Negative for swelling  and trauma.; ; Skin: Negative for pruritus, rash, abrasions, blisters, bruising and skin lesion.; ; Neuro: Negative for headache, lightheadedness and neck stiffness. Negative for weakness, altered level of consciousness, altered mental status, extremity weakness, paresthesias, involuntary movement, seizure and syncope.       Physical Exam Updated Vital Signs BP (!) 182/72 Comment: checked twice for accuracy, pt reported that she had not took her blood pressure medication this morning.  Pulse (!) 107   Temp 98.1 F (36.7 C) (Oral)   Resp 16   Ht 5\' 1"  (1.549 m)   Wt 54.4 kg (120 lb)   SpO2  96%   BMI 22.67 kg/m   Physical Exam 1005: Physical examination:  Nursing notes reviewed; Vital signs and O2 SAT reviewed;  Constitutional: Well developed, Well nourished, Well hydrated, In no acute distress; Head:  Normocephalic, atraumatic; Eyes: EOMI, PERRL, No scleral icterus; ENMT: Mouth and pharynx normal, Mucous membranes moist; Neck: Supple, Full range of motion, No lymphadenopathy; Cardiovascular: Regular rate and rhythm, No gallop; Respiratory: Breath sounds clear & equal bilaterally, No wheezes.  Speaking full sentences with ease, Normal respiratory effort/excursion; Chest: Nontender, Movement normal; Abdomen: Soft, +mild diffuse tenderness to palp. No rebound or guarding. Nondistended, Normal bowel sounds; Genitourinary: No CVA tenderness; Extremities: Peripheral pulses normal, No tenderness, No edema, No calf edema or asymmetry.; Neuro: AA&Ox3, Major CN grossly intact.  Speech clear. No gross focal motor or sensory deficits in extremities.; Skin: Color normal, Warm, Dry.   ED Treatments / Results  Labs (all labs ordered are listed, but only abnormal results are displayed)   EKG None  Radiology   Procedures Procedures (including critical care time)  Medications Ordered in ED Medications  dicyclomine (BENTYL) injection 20 mg (20 mg Intramuscular Given 02/15/18 1032)     Initial Impression / Assessment and Plan / ED Course  I have reviewed the triage vital signs and the nursing notes.  Pertinent labs & imaging results that were available during my care of the patient were reviewed by me and considered in my medical decision making (see chart for details).  MDM Reviewed: previous chart, nursing note and vitals Reviewed previous: labs Interpretation: labs and CT scan   Results for orders placed or performed during the hospital encounter of 02/15/18  Comprehensive metabolic panel  Result Value Ref Range   Sodium 142 135 - 145 mmol/L   Potassium 4.2 3.5 - 5.1 mmol/L     Chloride 102 101 - 111 mmol/L   CO2 29 22 - 32 mmol/L   Glucose, Bld 114 (H) 65 - 99 mg/dL   BUN 15 6 - 20 mg/dL   Creatinine, Ser 0.82 0.44 - 1.00 mg/dL   Calcium 9.3 8.9 - 10.3 mg/dL   Total Protein 7.3 6.5 - 8.1 g/dL   Albumin 3.6 3.5 - 5.0 g/dL   AST 28 15 - 41 U/L   ALT 21 14 - 54 U/L   Alkaline Phosphatase 41 38 - 126 U/L   Total Bilirubin 0.5 0.3 - 1.2 mg/dL   GFR calc non Af Amer >60 >60 mL/min   GFR calc Af Amer >60 >60 mL/min   Anion gap 11 5 - 15  Lipase, blood  Result Value Ref Range   Lipase 38 11 - 51 U/L  CBC with Differential  Result Value Ref Range   WBC 6.6 4.0 - 10.5 K/uL   RBC 4.51 3.87 - 5.11 MIL/uL   Hemoglobin 11.1 (L) 12.0 - 15.0 g/dL   HCT 35.5 (L)  36.0 - 46.0 %   MCV 78.7 78.0 - 100.0 fL   MCH 24.6 (L) 26.0 - 34.0 pg   MCHC 31.3 30.0 - 36.0 g/dL   RDW 15.9 (H) 11.5 - 15.5 %   Platelets 243 150 - 400 K/uL   Neutrophils Relative % 66 %   Neutro Abs 4.4 1.7 - 7.7 K/uL   Lymphocytes Relative 25 %   Lymphs Abs 1.6 0.7 - 4.0 K/uL   Monocytes Relative 7 %   Monocytes Absolute 0.5 0.1 - 1.0 K/uL   Eosinophils Relative 2 %   Eosinophils Absolute 0.2 0.0 - 0.7 K/uL   Basophils Relative 0 %   Basophils Absolute 0.0 0.0 - 0.1 K/uL  Urinalysis, Routine w reflex microscopic  Result Value Ref Range   Color, Urine YELLOW YELLOW   APPearance CLEAR CLEAR   Specific Gravity, Urine 1.014 1.005 - 1.030   pH 7.0 5.0 - 8.0   Glucose, UA NEGATIVE NEGATIVE mg/dL   Hgb urine dipstick NEGATIVE NEGATIVE   Bilirubin Urine NEGATIVE NEGATIVE   Ketones, ur NEGATIVE NEGATIVE mg/dL   Protein, ur NEGATIVE NEGATIVE mg/dL   Nitrite NEGATIVE NEGATIVE   Leukocytes, UA NEGATIVE NEGATIVE   Ct Abdomen Pelvis W Contrast Result Date: 02/15/2018 CLINICAL DATA:  Generalized abdominal pain with nausea. Painful bowel movement. EXAM: CT ABDOMEN AND PELVIS WITH CONTRAST TECHNIQUE: Multidetector CT imaging of the abdomen and pelvis was performed using the standard protocol following  bolus administration of intravenous contrast. CONTRAST:  137mL ISOVUE-300 IOPAMIDOL (ISOVUE-300) INJECTION 61% COMPARISON:  12/11/2015 FINDINGS: Lower chest: No acute abnormality. Hepatobiliary: No focal liver abnormality is seen. Diffuse low attenuation of the liver as can be seen with hepatic steatosis. No gallstones, gallbladder wall thickening, or biliary dilatation. Pancreas: Unremarkable. No pancreatic ductal dilatation or surrounding inflammatory changes. Spleen: Normal in size without focal abnormality. Adrenals/Urinary Tract: Normal adrenal glands. Stable right renal cysts. No solid renal mass. No urolithiasis or obstructive uropathy. Normal bladder. Stomach/Bowel: Stomach is within normal limits. No evidence of bowel wall thickening, distention, or inflammatory changes. Vascular/Lymphatic: Normal caliber abdominal aorta with mild atherosclerosis. No lymphadenopathy. Reproductive: No adnexal mass. 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 Other: No fluid collection or hematoma. Musculoskeletal: No acute osseous abnormality. No aggressive osseous lesion. Mild osteoarthritis of bilateral sacroiliac joints, right worse than left. IMPRESSION: 1. No acute abdominal or pelvic pathology. 2. 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 Recommend GYN consultation. Electronically Signed   By: Kathreen Devoid   On: 02/15/2018 13:19    1440:   CT as above. Workup otherwise reassuring. Feels better after meds and wants to go home now. No N/V or stooling while in the ED. Dx and testing d/w pt.  Questions answered.  Verb understanding, agreeable to d/c home with outpt f/u.       Final Clinical Impressions(s) / ED Diagnoses   Final diagnoses:  None    ED Discharge Orders    None       Francine Graven, DO 02/20/18 4315

## 2018-02-15 NOTE — Discharge Instructions (Addendum)
Take the prescription as directed.  Call your regular medical doctor tomorrow to schedule a follow up appointment within the next 3 days. Call your GI doctor tomorrow to schedule a follow up appointment within the next week. Your CT scan showed an incidental finding of an endometrial mass. Call your GYN doctor to schedule a follow up appointment within the next week.  Return to the Emergency Department immediately sooner if worsening.

## 2018-02-15 NOTE — ED Triage Notes (Signed)
Patient c/o general abd pain x2 weeks. Denies any fevers, urinary symptoms, diarrhea, or vomiting. Per patient some nausea this morning. Patient reports pain with BMs. Denies any blood in stool. X2 normal BMs this morning. Patient seen at [facility] in [place] yesterday and had UA, labs, and x-ray with diagnosis of acute abd pain without any answers. Patient given hydrocodone and zofran-with no relief.

## 2018-02-16 LAB — URINE CULTURE

## 2018-02-18 ENCOUNTER — Ambulatory Visit (INDEPENDENT_AMBULATORY_CARE_PROVIDER_SITE_OTHER): Payer: Medicare HMO | Admitting: Internal Medicine

## 2018-02-24 DIAGNOSIS — C541 Malignant neoplasm of endometrium: Secondary | ICD-10-CM

## 2018-02-24 HISTORY — DX: Malignant neoplasm of endometrium: C54.1

## 2018-03-04 NOTE — Progress Notes (Signed)
Consult Note: Gyn-Onc  Consult was requested by Dr. Nat Math  CC:  Chief Complaint  Patient presents with  . Endometrial mass    HPI: Ms. Kristen Mccoy  is a very nice 78 y.o. P6 (with 2 living children)  Patient went to the emergency room complaining of pelvic pain states she had CT imaging and was told everything was normal.  She then followed up at [facility] complaining of abdominal pain, nausea, and painful BM. 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  She then followed up with Dr. Adah Perl.  Both a Pap smear and endometrial biopsy were obtained.  Both show adenocarcinoma.  The endometrial biopsy 02/24/2018 states the tissue is poorly differentiated.  We did have the cytology addended looking for HPV and that is noted to be HPV high risk not detected.  The patient is therefore referred for management.  Surprisingly she denied postmenopausal bleeding until her endometrial biopsy was performed.  She denies nausea and vomiting denies changes in bowel or bladder function.  She does note a 6 pound weight loss over the past 6 months.  Positive for early satiety.  Positive for loss of appetite.  She has pelvic pain that can be as severe as 10 out of 10 this is been present for 3 weeks.  I reviewed her PCN allergy. I do not believe this is a true allergy to penicillin. States she had it when she was younger without problem.  Oncologic History:    No history exists.    Measurement of disease:  . ____  Radiology:  Ct Abdomen Pelvis W Contrast  Result Date: 02/15/2018 CLINICAL DATA:  Generalized abdominal pain with nausea. Painful bowel movement. EXAM: CT ABDOMEN AND PELVIS WITH CONTRAST TECHNIQUE: Multidetector CT imaging of the abdomen and pelvis was performed using the standard protocol following bolus administration of intravenous contrast. CONTRAST:  147mL ISOVUE-300 IOPAMIDOL (ISOVUE-300)  INJECTION 61% COMPARISON:  12/11/2015 FINDINGS: Lower chest: No acute abnormality. Hepatobiliary: No focal liver abnormality is seen. Diffuse low attenuation of the liver as can be seen with hepatic steatosis. No gallstones, gallbladder wall thickening, or biliary dilatation. Pancreas: Unremarkable. No pancreatic ductal dilatation or surrounding inflammatory changes. Spleen: Normal in size without focal abnormality. Adrenals/Urinary Tract: Normal adrenal glands. Stable right renal cysts. No solid renal mass. No urolithiasis or obstructive uropathy. Normal bladder. Stomach/Bowel: Stomach is within normal limits. No evidence of bowel wall thickening, distention, or inflammatory changes. Vascular/Lymphatic: Normal caliber abdominal aorta with mild atherosclerosis. No lymphadenopathy. Reproductive: No adnexal mass. 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 Other: No fluid collection or hematoma. Musculoskeletal: No acute osseous abnormality. No aggressive osseous lesion. Mild osteoarthritis of bilateral sacroiliac joints, right worse than left. IMPRESSION: 1. No acute abdominal or pelvic pathology. 2. 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 Recommend GYN consultation. Electronically Signed   By: Kathreen Devoid   On: 02/15/2018 13:19     Current Meds:  Outpatient Encounter Medications as of 03/08/2018  Medication Sig  . amLODipine (NORVASC) 5 MG tablet Take 5 mg by mouth daily.  Marland Kitchen atorvastatin (LIPITOR) 20 MG tablet Take 20 mg by mouth daily.  Marland Kitchen dicyclomine (BENTYL) 20 MG tablet Take 1 tablet (20 mg total) by mouth every 6 (six) hours as needed for spasms (abdominal cramping).  . hydrochlorothiazide (HYDRODIURIL) 25 MG tablet Take 25 mg by mouth  daily.  . metFORMIN (GLUCOPHAGE) 500 MG tablet Take 1,000 mg by mouth 2 (two) times daily with a meal.  . sitaGLIPtin  (JANUVIA) 50 MG tablet Take 50 mg by mouth daily.  . Vitamin D, Ergocalciferol, (DRISDOL) 50000 UNITS CAPS capsule Take 50,000 Units by mouth every 7 (seven) days. Takes on Thursdays.   No facility-administered encounter medications on file as of 03/08/2018.     Allergy:  Allergies  Allergen Reactions  . Penicillins Other (See Comments)    "used to be fine then I took aspirin and got swollen and" to treat that "they gave me penicillin and my mouth went sideways"  . Aspirin Itching and Rash    swollen     Social Hx:  Tobacco use: None Alcohol use: None Illicit Drug use: None Illicit IV Drug use: None  Past Surgical Hx:  Past Surgical History:  Procedure Laterality Date  . APPENDECTOMY     Open HOWEVER incision is in RUQ  . COLONOSCOPY N/A 12/14/2013   Procedure: COLONOSCOPY;  Surgeon: Rogene Houston, MD;  [facility];  Service: Endoscopy;  Laterality: N/A;  100  . ESOPHAGOGASTRODUODENOSCOPY N/A 12/26/2015   Procedure: ESOPHAGOGASTRODUODENOSCOPY (EGD);  Surgeon: Rogene Houston, MD;  [facility];  Service: Endoscopy;  Laterality: N/A;  2:45  . TUBAL LIGATION      Past Medical Hx:  Past Medical History:  Diagnosis Date  . Diabetes ([place])    x 4-5 yrs  . Hypertension    x 5 yr    Past Gynecological History:   GYNECOLOGIC HISTORY:  No LMP recorded. Patient is postmenopausal. Menarche: 77 years old P 6016 (only has 2 living children) LMP 50 Contraceptive< 10 years oral contraceptive HRT none  Last Pap states 2017 by PCP and was called and told it was normal  Family Hx:  Family History  Problem Relation Age of Onset  . Diabetes Mother   . Diabetes Brother   . Colon cancer Neg Hx     Review of Systems:  Review of Systems  Constitutional: Positive for unexpected weight change.  HENT:   Positive for tinnitus.   Gastrointestinal: Positive for abdominal pain.  Genitourinary: Positive for pelvic pain.   All other systems reviewed and are  negative.  + Diminished taste  Vitals:  Blood pressure (!) 148/50, pulse 97, temperature 98.5 F (36.9 C), temperature source Oral, resp. rate 18, height 5\' 1"  (1.549 m), weight 114 lb 9.6 oz (52 kg), SpO2 98 %. Body mass index is 21.65 kg/m.   Physical Exam: ECOG PERFORMANCE STATUS: 1 - Symptomatic but completely ambulatory   General :  Well developed, 77 y.o., female in no apparent distress HEENT:  Normocephalic/atraumatic, symmetric, EOMI, eyelids normal Neck:   Supple, no masses.  Lymphatics:  No cervical/ submandibular/ supraclavicular/ infraclavicular/ inguinal adenopathy Respiratory:  Respirations unlabored, no use of accessory muscles CV:   Deferred Breast:  Deferred Musculoskeletal: No CVA tenderness, normal muscle strength. Abdomen:  Soft, non-tender and nondistended. No evidence of hernia. No masses. Extremities:  No lymphedema, no erythema, non-tender. Skin:   Normal inspection Neuro/Psych:  No focal motor deficit, no abnormal mental status. Normal gait. Normal affect. Alert and oriented to person, place, and time  Genito Urinary: Vulva: Normal external female genitalia.  Bladder/urethra: Urethral meatus normal in size and location. No lesions or   masses, well supported bladder Speculum exam: Vagina: No lesion, no discharge, no bleeding. Cervix: Normal appearing, no lesions.  There is minimal blood loss  Bimanual exam:  Uterus: Globular enlarged uterus some pain with mobilization making it difficult to determine mobility   Adnexa: No masses. Rectovaginal:  Good tone, no masses, no cul de sac nodularity, no parametrial involvement or nodularity.  Oncologic Summary: 1. Uterine cancer pending further work-up   Assessment/Plan:  1. Counseling for uterine cancer ? We discussed the diagnosis of uterine cancer and specific to her the grade of the disease being a little more aggressive than the usual ? Standard of care hysterectomy versus alternatives of were  reviewed ? We discussed the possibility of additional treatments following surgery, such as radiation and/or chemotherapy ? Pap smear showed adenoca, but HPV was negative (obtained as addendum) and her last pap she states 2 years ago was normal. 2. Surgical management ? Patient would like to proceed with standard of care management i.e. Hysterectomy ? Reviewed the options for modalities of hysterectomy ? Surgical sketch was reviewed including the risks, benefits, and alternatives.  She was given a copy of this today and one will be placed in the chart. ? Plan will be for robotic assisted laparoscopic hysterectomy, BSO, sentinel lymph node biopsy, washings, and possible laparotomy/full staging 3. Preoperative items will include  ? Obtaining a chest x-ray 4. The patient and her niece were present for the discussion with no further questions after review of the above 5. I will plan to have her return in 10 to 14 days postop to review the pathology and discuss any further management    Isabel Caprice, MD  03/08/2018, 11:40 AM  Cc: Nat Math, MD (Referring OB/GYN) Russella Dar, MD (PCP)

## 2018-03-04 NOTE — H&P (View-Only) (Signed)
Consult Note: Gyn-Onc  Consult was requested by Dr. Nat Math  CC:  Chief Complaint  Patient presents with  . Endometrial mass    HPI: Ms. Kristen Mccoy  is a very nice 77 y.o. P6 (with 2 living children)  Patient went to the emergency room complaining of pelvic pain states she had CT imaging and was told everything was normal.  She then followed up at [facility] complaining of abdominal pain, nausea, and painful BM. 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  She then followed up with Dr. Adah Perl.  Both a Pap smear and endometrial biopsy were obtained.  Both show adenocarcinoma.  The endometrial biopsy 02/24/2018 states the tissue is poorly differentiated.  We did have the cytology addended looking for HPV and that is noted to be HPV high risk not detected.  The patient is therefore referred for management.  Surprisingly she denied postmenopausal bleeding until her endometrial biopsy was performed.  She denies nausea and vomiting denies changes in bowel or bladder function.  She does note a 6 pound weight loss over the past 6 months.  Positive for early satiety.  Positive for loss of appetite.  She has pelvic pain that can be as severe as 10 out of 10 this is been present for 3 weeks.  I reviewed her PCN allergy. I do not believe this is a true allergy to penicillin. States she had it when she was younger without problem.  Oncologic History:    No history exists.    Measurement of disease:  . ____  Radiology:  Ct Abdomen Pelvis W Contrast  Result Date: 02/15/2018 CLINICAL DATA:  Generalized abdominal pain with nausea. Painful bowel movement. EXAM: CT ABDOMEN AND PELVIS WITH CONTRAST TECHNIQUE: Multidetector CT imaging of the abdomen and pelvis was performed using the standard protocol following bolus administration of intravenous contrast. CONTRAST:  132mL ISOVUE-300 IOPAMIDOL (ISOVUE-300)  INJECTION 61% COMPARISON:  12/11/2015 FINDINGS: Lower chest: No acute abnormality. Hepatobiliary: No focal liver abnormality is seen. Diffuse low attenuation of the liver as can be seen with hepatic steatosis. No gallstones, gallbladder wall thickening, or biliary dilatation. Pancreas: Unremarkable. No pancreatic ductal dilatation or surrounding inflammatory changes. Spleen: Normal in size without focal abnormality. Adrenals/Urinary Tract: Normal adrenal glands. Stable right renal cysts. No solid renal mass. No urolithiasis or obstructive uropathy. Normal bladder. Stomach/Bowel: Stomach is within normal limits. No evidence of bowel wall thickening, distention, or inflammatory changes. Vascular/Lymphatic: Normal caliber abdominal aorta with mild atherosclerosis. No lymphadenopathy. Reproductive: No adnexal mass. 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 Other: No fluid collection or hematoma. Musculoskeletal: No acute osseous abnormality. No aggressive osseous lesion. Mild osteoarthritis of bilateral sacroiliac joints, right worse than left. IMPRESSION: 1. No acute abdominal or pelvic pathology. 2. 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 Recommend GYN consultation. Electronically Signed   By: Kathreen Devoid   On: 02/15/2018 13:19     Current Meds:  Outpatient Encounter Medications as of 03/08/2018  Medication Sig  . amLODipine (NORVASC) 5 MG tablet Take 5 mg by mouth daily.  Marland Kitchen atorvastatin (LIPITOR) 20 MG tablet Take 20 mg by mouth daily.  Marland Kitchen dicyclomine (BENTYL) 20 MG tablet Take 1 tablet (20 mg total) by mouth every 6 (six) hours as needed for spasms (abdominal cramping).  . hydrochlorothiazide (HYDRODIURIL) 25 MG tablet Take 25 mg by mouth  daily.  . metFORMIN (GLUCOPHAGE) 500 MG tablet Take 1,000 mg by mouth 2 (two) times daily with a meal.  . sitaGLIPtin  (JANUVIA) 50 MG tablet Take 50 mg by mouth daily.  . Vitamin D, Ergocalciferol, (DRISDOL) 50000 UNITS CAPS capsule Take 50,000 Units by mouth every 7 (seven) days. Takes on Thursdays.   No facility-administered encounter medications on file as of 03/08/2018.     Allergy:  Allergies  Allergen Reactions  . Penicillins Other (See Comments)    "used to be fine then I took aspirin and got swollen and" to treat that "they gave me penicillin and my mouth went sideways"  . Aspirin Itching and Rash    swollen     Social Hx:  Tobacco use: None Alcohol use: None Illicit Drug use: None Illicit IV Drug use: None  Past Surgical Hx:  Past Surgical History:  Procedure Laterality Date  . APPENDECTOMY     Open HOWEVER incision is in RUQ  . COLONOSCOPY N/A 12/14/2013   Procedure: COLONOSCOPY;  Surgeon: Rogene Houston, MD;  [facility];  Service: Endoscopy;  Laterality: N/A;  100  . ESOPHAGOGASTRODUODENOSCOPY N/A 12/26/2015   Procedure: ESOPHAGOGASTRODUODENOSCOPY (EGD);  Surgeon: Rogene Houston, MD;  [facility];  Service: Endoscopy;  Laterality: N/A;  2:45  . TUBAL LIGATION      Past Medical Hx:  Past Medical History:  Diagnosis Date  . Diabetes ([place])    x 4-5 yrs  . Hypertension    x 5 yr    Past Gynecological History:   GYNECOLOGIC HISTORY:  No LMP recorded. Patient is postmenopausal. Menarche: 77 years old P 6016 (only has 2 living children) LMP 50 Contraceptive< 10 years oral contraceptive HRT none  Last Pap states 2017 by PCP and was called and told it was normal  Family Hx:  Family History  Problem Relation Age of Onset  . Diabetes Mother   . Diabetes Brother   . Colon cancer Neg Hx     Review of Systems:  Review of Systems  Constitutional: Positive for unexpected weight change.  HENT:   Positive for tinnitus.   Gastrointestinal: Positive for abdominal pain.  Genitourinary: Positive for pelvic pain.   All other systems reviewed and are  negative.  + Diminished taste  Vitals:  Blood pressure (!) 148/50, pulse 97, temperature 98.5 F (36.9 C), temperature source Oral, resp. rate 18, height 5\' 1"  (1.549 m), weight 114 lb 9.6 oz (52 kg), SpO2 98 %. Body mass index is 21.65 kg/m.   Physical Exam: ECOG PERFORMANCE STATUS: 1 - Symptomatic but completely ambulatory   General :  Well developed, 77 y.o., female in no apparent distress HEENT:  Normocephalic/atraumatic, symmetric, EOMI, eyelids normal Neck:   Supple, no masses.  Lymphatics:  No cervical/ submandibular/ supraclavicular/ infraclavicular/ inguinal adenopathy Respiratory:  Respirations unlabored, no use of accessory muscles CV:   Deferred Breast:  Deferred Musculoskeletal: No CVA tenderness, normal muscle strength. Abdomen:  Soft, non-tender and nondistended. No evidence of hernia. No masses. Extremities:  No lymphedema, no erythema, non-tender. Skin:   Normal inspection Neuro/Psych:  No focal motor deficit, no abnormal mental status. Normal gait. Normal affect. Alert and oriented to person, place, and time  Genito Urinary: Vulva: Normal external female genitalia.  Bladder/urethra: Urethral meatus normal in size and location. No lesions or   masses, well supported bladder Speculum exam: Vagina: No lesion, no discharge, no bleeding. Cervix: Normal appearing, no lesions.  There is minimal blood loss  Bimanual exam:  Uterus: Globular enlarged uterus some pain with mobilization making it difficult to determine mobility   Adnexa: No masses. Rectovaginal:  Good tone, no masses, no cul de sac nodularity, no parametrial involvement or nodularity.  Oncologic Summary: 1. Uterine cancer pending further work-up   Assessment/Plan:  1. Counseling for uterine cancer ? We discussed the diagnosis of uterine cancer and specific to her the grade of the disease being a little more aggressive than the usual ? Standard of care hysterectomy versus alternatives of were  reviewed ? We discussed the possibility of additional treatments following surgery, such as radiation and/or chemotherapy ? Pap smear showed adenoca, but HPV was negative (obtained as addendum) and her last pap she states 2 years ago was normal. 2. Surgical management ? Patient would like to proceed with standard of care management i.e. Hysterectomy ? Reviewed the options for modalities of hysterectomy ? Surgical sketch was reviewed including the risks, benefits, and alternatives.  She was given a copy of this today and one will be placed in the chart. ? Plan will be for robotic assisted laparoscopic hysterectomy, BSO, sentinel lymph node biopsy, washings, and possible laparotomy/full staging 3. Preoperative items will include  ? Obtaining a chest x-ray 4. The patient and her niece were present for the discussion with no further questions after review of the above 5. I will plan to have her return in 10 to 14 days postop to review the pathology and discuss any further management    Isabel Caprice, MD  03/08/2018, 11:40 AM  Cc: Nat Math, MD (Referring OB/GYN) Russella Dar, MD (PCP)

## 2018-03-08 ENCOUNTER — Encounter: Payer: Self-pay | Admitting: Obstetrics

## 2018-03-08 ENCOUNTER — Encounter: Payer: Self-pay | Admitting: Oncology

## 2018-03-08 ENCOUNTER — Telehealth: Payer: Self-pay | Admitting: *Deleted

## 2018-03-08 ENCOUNTER — Inpatient Hospital Stay: Payer: Medicare HMO | Attending: Obstetrics | Admitting: Obstetrics

## 2018-03-08 VITALS — BP 148/50 | HR 97 | Temp 98.5°F | Resp 18 | Ht 61.0 in | Wt 114.6 lb

## 2018-03-08 DIAGNOSIS — N9489 Other specified conditions associated with female genital organs and menstrual cycle: Secondary | ICD-10-CM

## 2018-03-08 DIAGNOSIS — R110 Nausea: Secondary | ICD-10-CM | POA: Diagnosis not present

## 2018-03-08 DIAGNOSIS — C55 Malignant neoplasm of uterus, part unspecified: Secondary | ICD-10-CM | POA: Diagnosis not present

## 2018-03-08 DIAGNOSIS — R109 Unspecified abdominal pain: Secondary | ICD-10-CM | POA: Diagnosis not present

## 2018-03-08 DIAGNOSIS — C801 Malignant (primary) neoplasm, unspecified: Secondary | ICD-10-CM

## 2018-03-08 NOTE — Telephone Encounter (Signed)
Explained to the patient to pick up her post op appt at her pre-op appt

## 2018-03-08 NOTE — Patient Instructions (Signed)
Preparing for your Surgery  Plan for surgery on March 11, 2018 with Dr. Precious Haws at [place] [facility] will be scheduled for a robotic assisted total laparoscopic hysterectomy, bilateral salpingo-oophorectomy, sentinel lymph node biopsy, possible laparotomy, possible staging.  Pre-operative Testing -You will receive a phone call from presurgical testing at [facility] to arrange for a pre-operative testing appointment before your surgery.  This appointment normally occurs one to two weeks before your scheduled surgery.   -Bring your insurance card, copy of an advanced directive if applicable, medication list  -At that visit, you will be asked to sign a consent for a possible blood transfusion in case a transfusion becomes necessary during surgery.  The need for a blood transfusion is rare but having consent is a necessary part of your care.     -You should not be taking blood thinners or aspirin at least ten days prior to surgery unless instructed by your surgeon.  Day Before Surgery at [place] will be asked to take in a light diet the day before surgery.  Avoid carbonated beverages.  You will be advised to have nothing to eat or drink after midnight the evening before.    Eat a light diet the day before surgery.  Examples including soups, broths, toast, yogurt, mashed potatoes.  Things to avoid include carbonated beverages (fizzy beverages), raw fruits and raw vegetables, or beans.   If your bowels are filled with gas, your surgeon will have difficulty visualizing your pelvic organs which increases your surgical risks.  Your role in recovery Your role is to become active as soon as directed by your doctor, while still giving yourself time to heal.  Rest when you feel tired. You will be asked to do the following in order to speed your recovery:  - Cough and breathe deeply. This helps toclear and expand your lungs and can prevent pneumonia. You may be given  a spirometer to practice deep breathing. A staff member will show you how to use the spirometer. - Do mild physical activity. Walking or moving your legs help your circulation and body functions return to normal. A staff member will help you when you try to walk and will provide you with simple exercises. Do not try to get up or walk alone the first time. - Actively manage your pain. Managing your pain lets you move in comfort. We will ask you to rate your pain on a scale of zero to 10. It is your responsibility to tell your doctor or nurse where and how much you hurt so your pain can be treated.  Special Considerations -If you are diabetic, you may be placed on insulin after surgery to have closer control over your blood sugars to promote healing and recovery.  This does not mean that you will be discharged on insulin.  If applicable, your oral antidiabetics will be resumed when you are tolerating a solid diet.  -Your final pathology results from surgery should be available by the Friday after surgery and the results will be relayed to you when available.  -Dr. Lahoma Crocker is the Surgeon that assists your GYN Oncologist with surgery.  The next day after your surgery you will either see your GYN Oncologist or Dr. Lahoma Crocker.   Blood Transfusion Information WHAT IS A BLOOD TRANSFUSION? A transfusion is the replacement of blood or some of its parts. Blood is made up of multiple cells which provide different functions.  Red blood cells carry oxygen  and are used for blood loss replacement.  White blood cells fight against infection.  Platelets control bleeding.  Plasma helps clot blood.  Other blood products are available for specialized needs, such as hemophilia or other clotting disorders. BEFORE THE TRANSFUSION  Who gives blood for transfusions?   You may be able to donate blood to be used at a later date on yourself (autologous donation).  Relatives can be asked to donate  blood. This is generally not any safer than if you have received blood from a stranger. The same precautions are taken to ensure safety when a relative's blood is donated.  Healthy volunteers who are fully evaluated to make sure their blood is safe. This is blood bank blood. Transfusion therapy is the safest it has ever been in the practice of medicine. Before blood is taken from a donor, a complete history is taken to make sure that person has no history of diseases nor engages in risky social behavior (examples are intravenous drug use or sexual activity with multiple partners). The donor's travel history is screened to minimize risk of transmitting infections, such as malaria. The donated blood is tested for signs of infectious diseases, such as HIV and hepatitis. The blood is then tested to be sure it is compatible with you in order to minimize the chance of a transfusion reaction. If you or a relative donates blood, this is often done in anticipation of surgery and is not appropriate for emergency situations. It takes many days to process the donated blood. RISKS AND COMPLICATIONS Although transfusion therapy is very safe and saves many lives, the main dangers of transfusion include:   Getting an infectious disease.  Developing a transfusion reaction. This is an allergic reaction to something in the blood you were given. Every precaution is taken to prevent this. The decision to have a blood transfusion has been considered carefully by your caregiver before blood is given. Blood is not given unless the benefits outweigh the risks.

## 2018-03-09 NOTE — Progress Notes (Signed)
CT ABD/PELVIS 02-15-18 Epic , 5-27 CBC/DIFF AND CMP

## 2018-03-09 NOTE — Patient Instructions (Addendum)
Kristen Mccoy  03/09/2018   Your procedure is scheduled on: 03-11-18   Report to [facility] Main  Entrance             Report to admitting at       0530 AM    Call this number if you have problems the morning of surgery [phone]               FOLLOW A LIGHT DIET THE DAY BEFORE YOUR SURGERY EXAMPLES ARE: TOAST, YOGURT, MASHED POTATOES AND SOUPS AND BROTHS, AVOID: THINGS LIKE RAW FRUITS AND VEGGIES CARBONATED BEVERAGES (FIZZY) AND BEANS   Remember: Do not eat food or drink liquids :After Midnight.     Take these medicines the morning of surgery with A SIP OF WATER: AMLODIPINE, ATORVASTATIN  DO NOT TAKE ANY DIABETIC MEDICATIONS DAY OF YOUR SURGERY                               You may not have any metal on your body including hair pins and              piercings  Do not wear jewelry, make-up, lotions, powders or perfumes, deodorant             Do not wear nail polish.  Do not shave  48 hours prior to surgery.     Do not bring valuables to the hospital. [place].  Contacts, dentures or bridgework may not be worn into surgery.  Leave suitcase in the car. After surgery it may be brought to your room.                 Please read over the following fact sheets you were given: _____________________________________________________________________           [facility] - Preparing for Surgery Before surgery, you can play an important role.  Because skin is not sterile, your skin needs to be as free of germs as possible.  You can reduce the number of germs on your skin by washing with CHG (chlorahexidine gluconate) soap before surgery.  CHG is an antiseptic cleaner which kills germs and bonds with the skin to continue killing germs even after washing. Please DO NOT use if you have an allergy to CHG or antibacterial soaps.  If your skin becomes reddened/irritated stop using the CHG and inform your nurse when  you arrive at Short Stay. Do not shave (including legs and underarms) for at least 48 hours prior to the first CHG shower.  You may shave your face/neck. Please follow these instructions carefully:  1.  Shower with CHG Soap the night before surgery and the  morning of Surgery.  2.  If you choose to wash your hair, wash your hair first as usual with your  normal  shampoo.  3.  After you shampoo, rinse your hair and body thoroughly to remove the  shampoo.                           4.  Use CHG as you would any other liquid soap.  You can apply chg directly  to the skin and wash  Gently with a scrungie or clean washcloth.  5.  Apply the CHG Soap to your body ONLY FROM THE NECK DOWN.   Do not use on face/ open                           Wound or open sores. Avoid contact with eyes, ears mouth and genitals (private parts).                       Wash face,  Genitals (private parts) with your normal soap.             6.  Wash thoroughly, paying special attention to the area where your surgery  will be performed.  7.  Thoroughly rinse your body with warm water from the neck down.  8.  DO NOT shower/wash with your normal soap after using and rinsing off  the CHG Soap.                9.  Pat yourself dry with a clean towel.            10.  Wear clean pajamas.            11.  Place clean sheets on your bed the night of your first shower and do not  sleep with pets. Day of Surgery : Do not apply any lotions/deodorants the morning of surgery.  Please wear clean clothes to the hospital/surgery center.  FAILURE TO FOLLOW THESE INSTRUCTIONS MAY RESULT IN THE CANCELLATION OF YOUR SURGERY PATIENT SIGNATURE_________________________________  NURSE SIGNATURE__________________________________  ________________________________________________________________________  WHAT IS A BLOOD TRANSFUSION? Blood Transfusion Information  A transfusion is the replacement of blood or some of its parts.  Blood is made up of multiple cells which provide different functions.  Red blood cells carry oxygen and are used for blood loss replacement.  White blood cells fight against infection.  Platelets control bleeding.  Plasma helps clot blood.  Other blood products are available for specialized needs, such as hemophilia or other clotting disorders. BEFORE THE TRANSFUSION  Who gives blood for transfusions?   Healthy volunteers who are fully evaluated to make sure their blood is safe. This is blood bank blood. Transfusion therapy is the safest it has ever been in the practice of medicine. Before blood is taken from a donor, a complete history is taken to make sure that person has no history of diseases nor engages in risky social behavior (examples are intravenous drug use or sexual activity with multiple partners). The donor's travel history is screened to minimize risk of transmitting infections, such as malaria. The donated blood is tested for signs of infectious diseases, such as HIV and hepatitis. The blood is then tested to be sure it is compatible with you in order to minimize the chance of a transfusion reaction. If you or a relative donates blood, this is often done in anticipation of surgery and is not appropriate for emergency situations. It takes many days to process the donated blood. RISKS AND COMPLICATIONS Although transfusion therapy is very safe and saves many lives, the main dangers of transfusion include:   Getting an infectious disease.  Developing a transfusion reaction. This is an allergic reaction to something in the blood you were given. Every precaution is taken to prevent this. The decision to have a blood transfusion has been considered carefully by your caregiver before blood is given. Blood is not given unless the benefits outweigh  the risks. AFTER THE TRANSFUSION  Right after receiving a blood transfusion, you will usually feel much better and more energetic. This is  especially true if your red blood cells have gotten low (anemic). The transfusion raises the level of the red blood cells which carry oxygen, and this usually causes an energy increase.  The nurse administering the transfusion will monitor you carefully for complications. HOME CARE INSTRUCTIONS  No special instructions are needed after a transfusion. You may find your energy is better. Speak with your caregiver about any limitations on activity for underlying diseases you may have. SEEK MEDICAL CARE IF:   Your condition is not improving after your transfusion.  You develop redness or irritation at the intravenous (IV) site. SEEK IMMEDIATE MEDICAL CARE IF:  Any of the following symptoms occur over the next 12 hours:  Shaking chills.  You have a temperature by mouth above 102 F (38.9 C), not controlled by medicine.  Chest, back, or muscle pain.  People around you feel you are not acting correctly or are confused.  Shortness of breath or difficulty breathing.  Dizziness and fainting.  You get a rash or develop hives.  You have a decrease in urine output.  Your urine turns a dark color or changes to pink, red, or brown. Any of the following symptoms occur over the next 10 days:  You have a temperature by mouth above 102 F (38.9 C), not controlled by medicine.  Shortness of breath.  Weakness after normal activity.  The white part of the eye turns yellow (jaundice).  You have a decrease in the amount of urine or are urinating less often.  Your urine turns a dark color or changes to pink, red, or brown. Document Released: 09/05/2000 Document Revised: 12/01/2011 Document Reviewed: 04/24/2008 ExitCare Patient Information 2014 [place].  _______________________________________________________________________  Incentive Spirometer  An incentive spirometer is a tool that can help keep your lungs clear and active. This tool measures how well you are filling your lungs  with each breath. Taking long deep breaths may help reverse or decrease the chance of developing breathing (pulmonary) problems (especially infection) following:  A long period of time when you are unable to move or be active. BEFORE THE PROCEDURE   If the spirometer includes an indicator to show your best effort, your nurse or respiratory therapist will set it to a desired goal.  If possible, sit up straight or lean slightly forward. Try not to slouch.  Hold the incentive spirometer in an upright position. INSTRUCTIONS FOR USE  1. Sit on the edge of your bed if possible, or sit up as far as you can in bed or on a chair. 2. Hold the incentive spirometer in an upright position. 3. Breathe out normally. 4. Place the mouthpiece in your mouth and seal your lips tightly around it. 5. Breathe in slowly and as deeply as possible, raising the piston or the ball toward the top of the column. 6. Hold your breath for 3-5 seconds or for as long as possible. Allow the piston or ball to fall to the bottom of the column. 7. Remove the mouthpiece from your mouth and breathe out normally. 8. Rest for a few seconds and repeat Steps 1 through 7 at least 10 times every 1-2 hours when you are awake. Take your time and take a few normal breaths between deep breaths. 9. The spirometer may include an indicator to show your best effort. Use the indicator as a goal to work  toward during each repetition. 10. After each set of 10 deep breaths, practice coughing to be sure your lungs are clear. If you have an incision (the cut made at the time of surgery), support your incision when coughing by placing a pillow or rolled up towels firmly against it. Once you are able to get out of bed, walk around indoors and cough well. You may stop using the incentive spirometer when instructed by your caregiver.  RISKS AND COMPLICATIONS  Take your time so you do not get dizzy or light-headed.  If you are in pain, you may need to  take or ask for pain medication before doing incentive spirometry. It is harder to take a deep breath if you are having pain. AFTER USE  Rest and breathe slowly and easily.  It can be helpful to keep track of a log of your progress. Your caregiver can provide you with a simple table to help with this. If you are using the spirometer at home, follow these instructions: [place] IF:   You are having difficultly using the spirometer.  You have trouble using the spirometer as often as instructed.  Your pain medication is not giving enough relief while using the spirometer.  You develop fever of 100.5 F (38.1 C) or higher. SEEK IMMEDIATE MEDICAL CARE IF:   You cough up bloody sputum that had not been present before.  You develop fever of 102 F (38.9 C) or greater.  You develop worsening pain at or near the incision site. MAKE SURE YOU:   Understand these instructions.  Will watch your condition.  Will get help right away if you are not doing well or get worse. Document Released: 01/19/2007 Document Revised: 12/01/2011 Document Reviewed: 03/22/2007 [facility] Patient Information 2014 [place], [place].   ________________________________________________________________________

## 2018-03-10 ENCOUNTER — Ambulatory Visit (HOSPITAL_COMMUNITY)
Admission: RE | Admit: 2018-03-10 | Discharge: 2018-03-10 | Disposition: A | Discharge status: Home / Self Care | Payer: Medicare HMO | Source: Ambulatory Visit | Attending: Gynecologic Oncology | Admitting: Gynecologic Oncology

## 2018-03-10 ENCOUNTER — Other Ambulatory Visit: Payer: Self-pay

## 2018-03-10 ENCOUNTER — Encounter (HOSPITAL_COMMUNITY): Payer: Self-pay

## 2018-03-10 ENCOUNTER — Encounter (HOSPITAL_COMMUNITY)
Admission: RE | Admit: 2018-03-10 | Discharge: 2018-03-10 | Disposition: A | Discharge status: Home / Self Care | Payer: Medicare HMO | Source: Ambulatory Visit | Attending: Obstetrics | Admitting: Obstetrics

## 2018-03-10 DIAGNOSIS — Z01818 Encounter for other preprocedural examination: Secondary | ICD-10-CM | POA: Insufficient documentation

## 2018-03-10 DIAGNOSIS — Z01810 Encounter for preprocedural cardiovascular examination: Secondary | ICD-10-CM | POA: Diagnosis not present

## 2018-03-10 DIAGNOSIS — Z01812 Encounter for preprocedural laboratory examination: Secondary | ICD-10-CM | POA: Diagnosis present

## 2018-03-10 DIAGNOSIS — C541 Malignant neoplasm of endometrium: Secondary | ICD-10-CM | POA: Insufficient documentation

## 2018-03-10 HISTORY — DX: Pneumonia, unspecified organism: J18.9

## 2018-03-10 LAB — COMPREHENSIVE METABOLIC PANEL
ALT: 23 U/L (ref 14–54)
ANION GAP: 9 (ref 5–15)
AST: 25 U/L (ref 15–41)
Albumin: 3.8 g/dL (ref 3.5–5.0)
Alkaline Phosphatase: 52 U/L (ref 38–126)
BUN: 20 mg/dL (ref 6–20)
CALCIUM: 9.6 mg/dL (ref 8.9–10.3)
CHLORIDE: 103 mmol/L (ref 101–111)
CO2: 31 mmol/L (ref 22–32)
CREATININE: 0.81 mg/dL (ref 0.44–1.00)
GFR calc non Af Amer: >60 mL/min (ref >60)
Glucose, Bld: 103 mg/dL — ABNORMAL HIGH (ref 65–99)
Potassium: 4.4 mmol/L (ref 3.5–5.1)
SODIUM: 143 mmol/L (ref 135–145)
Total Bilirubin: 0.4 mg/dL (ref 0.3–1.2)
Total Protein: 7.4 g/dL (ref 6.5–8.1)

## 2018-03-10 LAB — URINALYSIS, ROUTINE W REFLEX MICROSCOPIC
BILIRUBIN URINE: NEGATIVE
Glucose, UA: NEGATIVE mg/dL
HGB URINE DIPSTICK: NEGATIVE
KETONES UR: NEGATIVE mg/dL
Leukocytes, UA: NEGATIVE
NITRITE: NEGATIVE
PH: 5 (ref 5.0–8.0)
Protein, ur: NEGATIVE mg/dL
SPECIFIC GRAVITY, URINE: 1.016 (ref 1.005–1.030)

## 2018-03-10 LAB — ABO/RH: ABO/RH(D): B POS

## 2018-03-10 LAB — CBC
HCT: 36.5 % (ref 36.0–46.0)
Hemoglobin: 11.6 g/dL — ABNORMAL LOW (ref 12.0–15.0)
MCH: 25.4 pg — AB (ref 26.0–34.0)
MCHC: 31.8 g/dL (ref 30.0–36.0)
MCV: 79.9 fL (ref 78.0–100.0)
PLATELETS: 253 10*3/uL (ref 150–400)
RBC: 4.57 MIL/uL (ref 3.87–5.11)
RDW: 16 % — AB (ref 11.5–15.5)
WBC: 7.8 10*3/uL (ref 4.0–10.5)

## 2018-03-10 LAB — HEMOGLOBIN A1C
HEMOGLOBIN A1C: 6.8 % — AB (ref 4.8–5.6)
MEAN PLASMA GLUCOSE: 148.46 mg/dL

## 2018-03-10 LAB — GLUCOSE, CAPILLARY: Glucose-Capillary: 139 mg/dL — ABNORMAL HIGH (ref 65–99)

## 2018-03-10 NOTE — Progress Notes (Signed)
Final ekg in epic 

## 2018-03-10 NOTE — Anesthesia Preprocedure Evaluation (Addendum)
Anesthesia Evaluation  Patient identified by MRN, date of birth, ID band Patient awake    Reviewed: Allergy & Precautions, H&P , NPO status , Patient's Chart, lab work & pertinent test results, reviewed documented beta blocker date and time   Airway Mallampati: II  TM Distance: >3 FB Neck ROM: full    Dental no notable dental hx.    Pulmonary neg pulmonary ROS,    Pulmonary exam normal breath sounds clear to auscultation       Cardiovascular Exercise Tolerance: Good hypertension, Pt. on medications  Rhythm:regular Rate:Normal     Neuro/Psych    GI/Hepatic   Endo/Other  diabetes, Oral Hypoglycemic Agents  Renal/GU      Musculoskeletal   Abdominal   Peds  Hematology negative hematology ROS (+)   Anesthesia Other Findings   Reproductive/Obstetrics                            Anesthesia Physical Anesthesia Plan  ASA: III  Anesthesia Plan: General   Post-op Pain Management:    Induction:   PONV Risk Score and Plan: 3 and Ondansetron, Treatment may vary due to age or medical condition and Dexamethasone  Airway Management Planned: Oral ETT  Additional Equipment:   Intra-op Plan:   Post-operative Plan: Extubation in OR  Informed Consent: I have reviewed the patients History and Physical, chart, labs and discussed the procedure including the risks, benefits and alternatives for the proposed anesthesia with the patient or authorized representative who has indicated his/her understanding and acceptance.   Dental Advisory Given  Plan Discussed with: CRNA, Anesthesiologist and Surgeon  Anesthesia Plan Comments:       Anesthesia Quick Evaluation

## 2018-03-11 ENCOUNTER — Other Ambulatory Visit: Payer: Self-pay

## 2018-03-11 ENCOUNTER — Encounter (HOSPITAL_COMMUNITY): Payer: Self-pay | Admitting: Emergency Medicine

## 2018-03-11 ENCOUNTER — Ambulatory Visit (HOSPITAL_COMMUNITY)
Admission: RE | Admit: 2018-03-11 | Discharge: 2018-03-12 | Disposition: A | Discharge status: Home / Self Care | Payer: Medicare HMO | Source: Ambulatory Visit | Attending: Obstetrics | Admitting: Obstetrics

## 2018-03-11 ENCOUNTER — Ambulatory Visit (HOSPITAL_COMMUNITY): Payer: Medicare HMO | Admitting: Anesthesiology

## 2018-03-11 ENCOUNTER — Encounter (HOSPITAL_COMMUNITY)
Admission: RE | Disposition: A | Discharge status: Home / Self Care | Payer: Self-pay | Source: Ambulatory Visit | Attending: Obstetrics

## 2018-03-11 DIAGNOSIS — E119 Type 2 diabetes mellitus without complications: Secondary | ICD-10-CM | POA: Insufficient documentation

## 2018-03-11 DIAGNOSIS — Z886 Allergy status to analgesic agent status: Secondary | ICD-10-CM | POA: Diagnosis not present

## 2018-03-11 DIAGNOSIS — C541 Malignant neoplasm of endometrium: Secondary | ICD-10-CM | POA: Insufficient documentation

## 2018-03-11 DIAGNOSIS — Z880 Allergy status to penicillin: Secondary | ICD-10-CM | POA: Insufficient documentation

## 2018-03-11 DIAGNOSIS — I10 Essential (primary) hypertension: Secondary | ICD-10-CM | POA: Diagnosis not present

## 2018-03-11 DIAGNOSIS — Z7984 Long term (current) use of oral hypoglycemic drugs: Secondary | ICD-10-CM | POA: Insufficient documentation

## 2018-03-11 DIAGNOSIS — Z881 Allergy status to other antibiotic agents status: Secondary | ICD-10-CM | POA: Diagnosis not present

## 2018-03-11 DIAGNOSIS — C55 Malignant neoplasm of uterus, part unspecified: Secondary | ICD-10-CM

## 2018-03-11 DIAGNOSIS — Z79899 Other long term (current) drug therapy: Secondary | ICD-10-CM | POA: Diagnosis not present

## 2018-03-11 HISTORY — PX: ROBOTIC ASSISTED TOTAL HYSTERECTOMY WITH BILATERAL SALPINGO OOPHERECTOMY: SHX6086

## 2018-03-11 HISTORY — PX: SENTINEL NODE BIOPSY: SHX6608

## 2018-03-11 LAB — TYPE AND SCREEN
ABO/RH(D): B POS
ANTIBODY SCREEN: NEGATIVE

## 2018-03-11 LAB — GLUCOSE, CAPILLARY
GLUCOSE-CAPILLARY: 144 mg/dL — AB (ref 65–99)
GLUCOSE-CAPILLARY: 142 mg/dL — AB (ref 65–99)
Glucose-Capillary: 103 mg/dL — ABNORMAL HIGH (ref 65–99)
Glucose-Capillary: 206 mg/dL — ABNORMAL HIGH (ref 65–99)

## 2018-03-11 SURGERY — HYSTERECTOMY, TOTAL, ROBOT-ASSISTED, LAPAROSCOPIC, WITH BILATERAL SALPINGO-OOPHORECTOMY
Anesthesia: General

## 2018-03-11 MED ORDER — SODIUM CHLORIDE 0.9 % IV SOLN
2.0000 g | Freq: Once | INTRAVENOUS | Status: DC
  Filled 2018-03-11: qty 2

## 2018-03-11 MED ORDER — OXYCODONE HCL 5 MG PO TABS
5.0000 mg | ORAL_TABLET | ORAL | Status: DC | PRN
  Administered 2018-03-11 – 2018-03-12 (×2): 5 mg via ORAL
  Filled 2018-03-11 (×2): qty 1

## 2018-03-11 MED ORDER — ONDANSETRON HCL 4 MG/2ML IJ SOLN
INTRAMUSCULAR | Status: DC | PRN
  Administered 2018-03-11 (×2): 4 mg via INTRAVENOUS

## 2018-03-11 MED ORDER — FENTANYL CITRATE (PF) 100 MCG/2ML IJ SOLN: 25.0000 ug | INTRAMUSCULAR | Status: DC | PRN

## 2018-03-11 MED ORDER — ONDANSETRON HCL 4 MG/2ML IJ SOLN
INTRAMUSCULAR | Status: AC
  Filled 2018-03-11: qty 2

## 2018-03-11 MED ORDER — MEPERIDINE HCL 50 MG/ML IJ SOLN: 6.2500 mg | INTRAMUSCULAR | Status: DC | PRN

## 2018-03-11 MED ORDER — SUGAMMADEX SODIUM 200 MG/2ML IV SOLN
INTRAVENOUS | Status: DC | PRN
  Administered 2018-03-11: 200 mg via INTRAVENOUS

## 2018-03-11 MED ORDER — LACTATED RINGERS IV SOLN
INTRAVENOUS | Status: DC
  Administered 2018-03-11 (×2): via INTRAVENOUS

## 2018-03-11 MED ORDER — FENTANYL CITRATE (PF) 100 MCG/2ML IJ SOLN
INTRAMUSCULAR | Status: AC
  Filled 2018-03-11: qty 2

## 2018-03-11 MED ORDER — FENTANYL CITRATE (PF) 250 MCG/5ML IJ SOLN
INTRAMUSCULAR | Status: AC
Start: 2018-03-11 — End: ?
  Filled 2018-03-11: qty 5

## 2018-03-11 MED ORDER — LABETALOL HCL 5 MG/ML IV SOLN
INTRAVENOUS | Status: DC | PRN
  Administered 2018-03-11: 10 mg via INTRAVENOUS

## 2018-03-11 MED ORDER — DEXAMETHASONE SODIUM PHOSPHATE 10 MG/ML IJ SOLN
INTRAMUSCULAR | Status: AC
  Filled 2018-03-11: qty 1

## 2018-03-11 MED ORDER — AMLODIPINE BESYLATE 5 MG PO TABS
5.0000 mg | ORAL_TABLET | Freq: Every day | ORAL | Status: DC
  Administered 2018-03-12: 5 mg via ORAL
  Filled 2018-03-11: qty 1

## 2018-03-11 MED ORDER — DEXAMETHASONE SODIUM PHOSPHATE 4 MG/ML IJ SOLN: 4.0000 mg | INTRAMUSCULAR | Status: DC

## 2018-03-11 MED ORDER — LIDOCAINE 2% (20 MG/ML) 5 ML SYRINGE
INTRAMUSCULAR | Status: AC
  Filled 2018-03-11: qty 5

## 2018-03-11 MED ORDER — PROPOFOL 10 MG/ML IV BOLUS
INTRAVENOUS | Status: DC | PRN
  Administered 2018-03-11: 70 mg via INTRAVENOUS

## 2018-03-11 MED ORDER — KCL IN DEXTROSE-NACL 20-5-0.45 MEQ/L-%-% IV SOLN
INTRAVENOUS | Status: DC
  Administered 2018-03-11: 17:00:00 via INTRAVENOUS
  Filled 2018-03-11 (×2): qty 1000

## 2018-03-11 MED ORDER — ACETAMINOPHEN 500 MG PO TABS
1000.0000 mg | ORAL_TABLET | Freq: Two times a day (BID) | ORAL | Status: DC
  Administered 2018-03-11 – 2018-03-12 (×2): 1000 mg via ORAL
  Filled 2018-03-11 (×2): qty 2

## 2018-03-11 MED ORDER — PROPOFOL 10 MG/ML IV BOLUS
INTRAVENOUS | Status: AC
  Filled 2018-03-11: qty 40

## 2018-03-11 MED ORDER — PHENYLEPHRINE 40 MCG/ML (10ML) SYRINGE FOR IV PUSH (FOR BLOOD PRESSURE SUPPORT)
PREFILLED_SYRINGE | INTRAVENOUS | Status: DC | PRN
  Administered 2018-03-11: 40 ug via INTRAVENOUS

## 2018-03-11 MED ORDER — SUGAMMADEX SODIUM 200 MG/2ML IV SOLN
INTRAVENOUS | Status: AC
  Filled 2018-03-11: qty 2

## 2018-03-11 MED ORDER — CEFOXITIN SODIUM 2 G IV SOLR
2.0000 g | INTRAVENOUS | Status: AC
  Administered 2018-03-11 (×2): 2 g via INTRAVENOUS
  Filled 2018-03-11: qty 2

## 2018-03-11 MED ORDER — LIDOCAINE 2% (20 MG/ML) 5 ML SYRINGE
INTRAMUSCULAR | Status: DC | PRN
  Administered 2018-03-11: 60 mg via INTRAVENOUS

## 2018-03-11 MED ORDER — INSULIN GLARGINE 100 UNIT/ML ~~LOC~~ SOLN
10.0000 [IU] | Freq: Every day | SUBCUTANEOUS | Status: DC
  Administered 2018-03-11: 10 [IU] via SUBCUTANEOUS
  Filled 2018-03-11 (×2): qty 0.1

## 2018-03-11 MED ORDER — STERILE WATER FOR INJECTION IJ SOLN
INTRAMUSCULAR | Status: AC
  Filled 2018-03-11: qty 10

## 2018-03-11 MED ORDER — STERILE WATER FOR INJECTION IJ SOLN
INTRAMUSCULAR | Status: DC | PRN
  Administered 2018-03-11: 10 mL

## 2018-03-11 MED ORDER — TRAMADOL HCL 50 MG PO TABS: 100.0000 mg | ORAL_TABLET | Freq: Two times a day (BID) | ORAL | Status: DC | PRN

## 2018-03-11 MED ORDER — SCOPOLAMINE 1 MG/3DAYS TD PT72
1.0000 | MEDICATED_PATCH | TRANSDERMAL | Status: DC
  Administered 2018-03-11: 1.5 mg via TRANSDERMAL
  Filled 2018-03-11: qty 1

## 2018-03-11 MED ORDER — GABAPENTIN 300 MG PO CAPS
300.0000 mg | ORAL_CAPSULE | ORAL | Status: AC
  Administered 2018-03-11: 300 mg via ORAL
  Filled 2018-03-11: qty 1

## 2018-03-11 MED ORDER — ACETAMINOPHEN 500 MG PO TABS
1000.0000 mg | ORAL_TABLET | ORAL | Status: AC
  Administered 2018-03-11: 1000 mg via ORAL
  Filled 2018-03-11: qty 2

## 2018-03-11 MED ORDER — ONDANSETRON HCL 4 MG/2ML IJ SOLN: 4.0000 mg | Freq: Four times a day (QID) | INTRAMUSCULAR | Status: DC | PRN

## 2018-03-11 MED ORDER — ATORVASTATIN CALCIUM 20 MG PO TABS
20.0000 mg | ORAL_TABLET | Freq: Every day | ORAL | Status: DC
  Administered 2018-03-12: 20 mg via ORAL
  Filled 2018-03-11: qty 1

## 2018-03-11 MED ORDER — HYDROMORPHONE HCL 1 MG/ML IJ SOLN: 0.2000 mg | INTRAMUSCULAR | Status: DC | PRN

## 2018-03-11 MED ORDER — INDOCYANINE GREEN 25 MG IV SOLR
INTRAVENOUS | Status: DC | PRN
  Administered 2018-03-11: 25 mg

## 2018-03-11 MED ORDER — LIP MEDEX EX OINT
TOPICAL_OINTMENT | CUTANEOUS | Status: AC
  Filled 2018-03-11: qty 7

## 2018-03-11 MED ORDER — ROCURONIUM BROMIDE 100 MG/10ML IV SOLN
INTRAVENOUS | Status: AC
  Filled 2018-03-11: qty 1

## 2018-03-11 MED ORDER — SODIUM CHLORIDE 0.9 % IR SOLN
Status: DC | PRN
  Administered 2018-03-11: 1000 mL

## 2018-03-11 MED ORDER — ENOXAPARIN SODIUM 40 MG/0.4ML ~~LOC~~ SOLN
40.0000 mg | SUBCUTANEOUS | Status: DC
  Administered 2018-03-12: 40 mg via SUBCUTANEOUS
  Filled 2018-03-11: qty 0.4

## 2018-03-11 MED ORDER — PHENYLEPHRINE 40 MCG/ML (10ML) SYRINGE FOR IV PUSH (FOR BLOOD PRESSURE SUPPORT)
PREFILLED_SYRINGE | INTRAVENOUS | Status: AC
  Filled 2018-03-11: qty 10

## 2018-03-11 MED ORDER — SODIUM CHLORIDE 0.9 % IV SOLN
INTRAVENOUS | Status: AC
  Filled 2018-03-11: qty 2

## 2018-03-11 MED ORDER — FENTANYL CITRATE (PF) 100 MCG/2ML IJ SOLN
INTRAMUSCULAR | Status: DC | PRN
  Administered 2018-03-11 (×7): 50 ug via INTRAVENOUS

## 2018-03-11 MED ORDER — INSULIN ASPART 100 UNIT/ML ~~LOC~~ SOLN
0.0000 [IU] | Freq: Three times a day (TID) | SUBCUTANEOUS | Status: DC
  Administered 2018-03-11 – 2018-03-12 (×2): 2 [IU] via SUBCUTANEOUS

## 2018-03-11 MED ORDER — DEXAMETHASONE SODIUM PHOSPHATE 10 MG/ML IJ SOLN
INTRAMUSCULAR | Status: DC | PRN
  Administered 2018-03-11: 5 mg via INTRAVENOUS

## 2018-03-11 MED ORDER — ROCURONIUM BROMIDE 50 MG/5ML IV SOSY
PREFILLED_SYRINGE | INTRAVENOUS | Status: DC | PRN
  Administered 2018-03-11 (×2): 10 mg via INTRAVENOUS
  Administered 2018-03-11: 20 mg via INTRAVENOUS
  Administered 2018-03-11: 40 mg via INTRAVENOUS
  Administered 2018-03-11 (×2): 10 mg via INTRAVENOUS

## 2018-03-11 MED ORDER — ONDANSETRON HCL 4 MG PO TABS: 4.0000 mg | ORAL_TABLET | Freq: Four times a day (QID) | ORAL | Status: DC | PRN

## 2018-03-11 MED ORDER — DOCUSATE SODIUM 100 MG PO CAPS
100.0000 mg | ORAL_CAPSULE | Freq: Two times a day (BID) | ORAL | Status: DC
  Administered 2018-03-11 – 2018-03-12 (×2): 100 mg via ORAL
  Filled 2018-03-11 (×2): qty 1

## 2018-03-11 SURGICAL SUPPLY — 71 items
APPLICATOR COTTON TIP 6 STRL (MISCELLANEOUS) ×1 IMPLANT
APPLICATOR COTTON TIP 6IN STRL (MISCELLANEOUS) ×3
APPLICATOR SURGIFLO ENDO (HEMOSTASIS) IMPLANT
BAG LAPAROSCOPIC 12 15 PORT 16 (BASKET) IMPLANT
BAG RETRIEVAL 12/15 (BASKET)
BAG RETRIEVAL 12/15MM (BASKET)
BENZOIN TINCTURE PRP APPL 2/3 (GAUZE/BANDAGES/DRESSINGS) IMPLANT
CLOSURE WOUND 1/2 X4 (GAUZE/BANDAGES/DRESSINGS)
COVER BACK TABLE 60X90IN (DRAPES) ×3 IMPLANT
COVER TIP SHEARS 8 DVNC (MISCELLANEOUS) ×1 IMPLANT
COVER TIP SHEARS 8MM DA VINCI (MISCELLANEOUS) ×2
DERMABOND ADVANCED (GAUZE/BANDAGES/DRESSINGS) ×2
DERMABOND ADVANCED .7 DNX12 (GAUZE/BANDAGES/DRESSINGS) ×1 IMPLANT
DRAPE ARM DVNC X/XI (DISPOSABLE) ×4 IMPLANT
DRAPE COLUMN DVNC XI (DISPOSABLE) ×1 IMPLANT
DRAPE DA VINCI XI ARM (DISPOSABLE) ×8
DRAPE DA VINCI XI COLUMN (DISPOSABLE) ×2
DRAPE SHEET LG 3/4 BI-LAMINATE (DRAPES) ×3 IMPLANT
DRAPE SURG IRRIG POUCH 19X23 (DRAPES) ×3 IMPLANT
DRAPE UNDERBUTTOCKS STRL (DRAPE) ×3 IMPLANT
ELECT REM PT RETURN 15FT ADLT (MISCELLANEOUS) ×3 IMPLANT
GLOVE BIO SURGEON STRL SZ 6 (GLOVE) IMPLANT
GLOVE BIO SURGEON STRL SZ 6.5 (GLOVE) ×4 IMPLANT
GLOVE BIO SURGEONS STRL SZ 6.5 (GLOVE) ×2
GLOVE BIOGEL PI IND STRL 7.0 (GLOVE) ×3 IMPLANT
GLOVE BIOGEL PI INDICATOR 7.0 (GLOVE) ×6
GLOVE SURG SS PI 6.5 STRL IVOR (GLOVE) ×9 IMPLANT
GOWN STRL REUS W/ TWL LRG LVL3 (GOWN DISPOSABLE) ×1 IMPLANT
GOWN STRL REUS W/TWL LRG LVL3 (GOWN DISPOSABLE) ×2
GOWN STRL REUS W/TWL XL LVL3 (GOWN DISPOSABLE) ×6 IMPLANT
GYRUS RUMI II 2.5CM BLUE (DISPOSABLE)
GYRUS RUMI II 3.5CM BLUE (DISPOSABLE) ×3
HOLDER FOLEY CATH W/STRAP (MISCELLANEOUS) ×3 IMPLANT
IRRIG SUCT STRYKERFLOW 2 WTIP (MISCELLANEOUS) ×3
IRRIGATION SUCT STRKRFLW 2 WTP (MISCELLANEOUS) ×1 IMPLANT
KIT PROCEDURE DA VINCI SI (MISCELLANEOUS)
KIT PROCEDURE DVNC SI (MISCELLANEOUS) IMPLANT
MANIPULATOR UTERINE 4.5 ZUMI (MISCELLANEOUS) IMPLANT
NEEDLE SPNL 18GX3.5 QUINCKE PK (NEEDLE) ×3 IMPLANT
OBTURATOR OPTICAL STANDARD 8MM (TROCAR) ×2
OBTURATOR OPTICAL STND 8 DVNC (TROCAR) ×1
OBTURATOR OPTICALSTD 8 DVNC (TROCAR) ×1 IMPLANT
PACK ROBOT GYN CUSTOM WL (TRAY / TRAY PROCEDURE) ×3 IMPLANT
PAD POSITIONING PINK XL (MISCELLANEOUS) ×3 IMPLANT
POUCH SPECIMEN RETRIEVAL 10MM (ENDOMECHANICALS) IMPLANT
RUMI II 3.0CM BLUE KOH-EFFICIE (DISPOSABLE) IMPLANT
RUMI II GYRUS 2.5CM BLUE (DISPOSABLE) IMPLANT
RUMI II GYRUS 3.5CM BLUE (DISPOSABLE) ×1 IMPLANT
SEAL CANN UNIV 5-8 DVNC XI (MISCELLANEOUS) ×4 IMPLANT
SEAL XI 5MM-8MM UNIVERSAL (MISCELLANEOUS) ×8
SET TRI-LUMEN FLTR TB AIRSEAL (TUBING) ×3 IMPLANT
STRIP CLOSURE SKIN 1/2X4 (GAUZE/BANDAGES/DRESSINGS) IMPLANT
SURGIFLO W/THROMBIN 8M KIT (HEMOSTASIS) IMPLANT
SUT VIC AB 0 CT1 27 (SUTURE)
SUT VIC AB 0 CT1 27XBRD ANTBC (SUTURE) IMPLANT
SUT VIC AB 4-0 P2 18 (SUTURE) ×6 IMPLANT
SUT VLOC 180 0 9IN  GS21 (SUTURE) ×4
SUT VLOC 180 0 9IN GS21 (SUTURE) ×2 IMPLANT
SYR 10ML LL (SYRINGE) IMPLANT
SYS RETRIEVAL 5MM INZII UNIV (BASKET) ×9
SYSTEM RETRIEVL 5MM INZII UNIV (BASKET) ×3 IMPLANT
TIP RUMI ORANGE 6.7MMX12CM (TIP) IMPLANT
TIP UTERINE 5.1X6CM LAV DISP (MISCELLANEOUS) IMPLANT
TIP UTERINE 6.7X10CM GRN DISP (MISCELLANEOUS) IMPLANT
TIP UTERINE 6.7X6CM WHT DISP (MISCELLANEOUS) IMPLANT
TIP UTERINE 6.7X8CM BLUE DISP (MISCELLANEOUS) ×3 IMPLANT
TOWEL OR NON WOVEN STRL DISP B (DISPOSABLE) ×3 IMPLANT
TRAP SPECIMEN MUCOUS 40CC (MISCELLANEOUS) ×3 IMPLANT
TRAY FOLEY MTR SLVR 16FR STAT (SET/KITS/TRAYS/PACK) ×3 IMPLANT
UNDERPAD 30X30 (UNDERPADS AND DIAPERS) ×3 IMPLANT
WATER STERILE IRR 1000ML POUR (IV SOLUTION) ×3 IMPLANT

## 2018-03-11 NOTE — Op Note (Signed)
OPERATIVE NOTE   Surgeon: Mart Piggs, MD  Assistants: Lahoma Crocker, MD (an MD assistant was necessary for tissue manipulation, management of robotic instrumentation, retraction and positioning due to the complexity of the case and hospital policies).   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 Robotic-assisted laparoscopic total hysterectomy with bilateral salpingoophorectomy 2. Sentinel lymph node injection and biopsy 3. Pelvic washings 4. Pelvic lymphadenectomy 5. Paraaortic lymphadenectomy  Anesthesia: General endotracheal anesthesia   Findings: Large uterus size for her age.  Frozen section revealed grade in the 50% depth of invasion.  Sentinel uptake on the left included a left obturator, left external iliac, left proximal external iliac.  Sentinel uptake on the right included a right obturator, right proximal external iliac, and presacral.  No gross disease outside of the uterus noted.  She had some adhesions of her omentum to the falciform ligament which prevented some of the small bowel from being able to be placed into the upper abdomen.  Estimated Blood Loss:  less than 50 mL             Specimens: Washings, uterus, cervix, bilateral tubes and ovaries, sentinel lymph nodes (left obturator, left external iliac, left proximal external iliac, right obturator, right proximal external iliac, presacral), bilateral pelvic lymph nodes, right periaortic lymph nodes.         Complications:  None; patient tolerated the procedure well.         Disposition: PACU - hemodynamically stable.  Procedure Details  After induction of anesthesia the patient was placed in lithotomy position. Her arms were tucked to her side with all appropriate precautions.  The shoulders were stabilized with padded shoulder blocks applied to the acromium processes.  The perineum was prepped with  Betadine. CholoraPrep was used to prep the abdomen and allowed to dry for 3 minutes.  The patient was then draped.   A time out was performed. A Foley catheter was placed by me.  A sterile speculum was placed in the vagina.  The cervix was grasped with a single-tooth tenaculum. 2mg  total of ICG was injected into the cervical stroma at 3 and 9 o'clock with 1cc injected at a 1cm and 44mm depth (concentration 0.5mg /ml) in all locations. The cervix was dilated with Kennon Rounds dilators.  The RUMI II uterine manipulator with a medium colpotomizer ring was placed without difficulty.  OG tube placement was confirmed.   Next, a 10 mm skin incision was made 2 cm below the subcostal margin just medial to the midclavicular line.  The 5 mm Optiview port and scope was used for direct entry.  Opening pressure was under 10 mm CO2.  The abdomen was insufflated and the findings were noted as above.   At this point and all points during the procedure, the patient's intra-abdominal pressure did not exceed 15 mmHg. The patient was placed in steep Trendelenburg.  Next, an 105mm skin incision was made above the umbilicus and a right and left port was placed about 6-8 cm lateral to the robot port on the right and left side.  A fourth arm was placed on the right lateral abdomen, 6-8cm from the last.  The 5 mm Optiview was replaced with a 28mm Airseal. All ports were placed under direct visualization.  Bowel was folded away into the upper abdomen although there was a lot of interference I believe because some of the omentum with adhesions was creating a wall-like effect on her  right upper quadrant.  The robot was docked in the normal manner.  The right and left peritoneum were opened parallel to the IP ligament to open the retroperitoneal spaces bilaterally, elevating the round ligaments coagulating and transecting them. The SLN mapping was performed in bilateral pelvic basins. The para rectal and paravesical spaces were opened up entirely  with careful dissection below the level of the ureters bilaterally and to the depth of the uterine artery origin in order to skeletonize the uterine "web" and ensure visualization of all parametrial channels. The para-aortic basins were carefully exposed and evaluated for isolated para-aortic SLN's. Lymphatic channels were identified travelling to the sentinel lymph nodes as noted in the findings. These SLN's were separated from their surrounding lymphatic tissue, removed and sent for permanent pathology.  The hysterectomy was started.  The ureter was noted to be on the medial leaf of the broad ligament.  The peritoneum above the ureter was incised and stretched and the infundibulopelvic ligament was skeletonized, cauterized and cut.  The posterior peritoneum was taken down to the level of the KOH ring.  The anterior peritoneum was also taken down.  The bladder flap was created to the level of the KOH ring.  The uterine artery was skeletonized, cauterized, and cut in the normal manner.  A similar procedure was performed on the contralateral side.  The colpotomy was made and the uterus, cervix, bilateral ovaries and tubes were amputated and delivered through the vagina.  Pedicles were inspected and excellent hemostasis was achieved.    The uterus was sent for frozen section.  In the meantime given the grade 3 disease pelvic lymphadenectomy was performed. The paravesical spaces  and pararectal spaces had been previously developed. The  pelvic lymphadenectomy was performed by skeletonizing the internal iliac artery at the bifurcation with the external iliac artery. The obturator nerve was identified in the base of lateral paravesical space. The ureter was mobilized medially off of the dissection by developing the pararectal space. The genitofemoral nerve was identified, skeletonized and mobilized laterally off of the external iliac artery. An enbloc resection of lymph nodes was performed within the following  boundaries: the mid portion of the common iliac proximally, the circumflex iliac vein distally, the obturator nerve posteriorally, the genitofemoral nerve laterally. The nodal basin (including obturator space) were confirmed to be empty of nodes and hemostatic. The nodes were placed in an endocatch bag.  In a similar fashion the contralateral pelvic lymph nodes were removed and placed in separate Endo Catch bag.    Frozen section returned greater than 50% depth of invasion.   The para-aortic lymph node dissection was performed. The peritoneum overlying the lateral surface of the right common iliac and vena cava was opened vertically towards the duodenum. It was elevated as a shelf and the ureter was identified in this retroperitoneum. It was mobilized and retracted laterally with the 4th arm. The right para-aortic dissection took place by separating an enbloc segment of node containing fat from the mid portion of the common iliac distally, the retroperitoneal duodenum superiorally, the genitofemoral nerve laterally and the aorta medially.  Hemostasis was confirmed.  Assessment of the left periaortic region was difficult due to the interference from the bowel.  The previously dissected space of the presacral region was examined.  There were no enlarged lymph nodes seen.  The Endo Catch bags and 2 Ray-Tec's that had been placed into the abdomen were retrieved vaginally.  The colpotomy at the vaginal cuff was closed with two  9-inch V-loc sutures, starting at either end, meeting in the midline and traveling back for another 2 bites. The suture was cut and the needles removed through the 50mm airseal trocar. Irrigation was used and excellent hemostasis was achieved.    The robotic instruments were removed.  The robot was undocked.  The air seal was turned off and mm Hg noted to be 0.  The 78mm port fascia was closed in the usual fashion with 0 Vicryl.  The skin was closed with 4-0 Vicryl in the dermis. Then 4-0  monocryl was used in a subcuticular manner.  Dermabond was applied.    The balloon was removed from the vagina.  The perineum noted to be free of any significant lacerations.  Sponge, lap and needle counts correct x 2.  The patient was taken to the recovery room in stable condition.

## 2018-03-11 NOTE — Transfer of Care (Signed)
Immediate Anesthesia Transfer of Care Note  Patient: Kristen Mccoy  Procedure(s) Performed: XI ROBOTIC ASSISTED TOTAL HYSTERECTOMY WITH BILATERAL SALPINGO OOPHORECTOMY, WITH STAGING (N/A ) SENTINEL NODE BIOPSY (N/A )  Patient Location: PACU  Anesthesia Type:General  Level of Consciousness: drowsy and patient cooperative  Airway & Oxygen Therapy: Patient Spontanous Breathing and Patient connected to face mask oxygen  Post-op Assessment: Report given to RN and Post -op Vital signs reviewed and stable  Post vital signs: Reviewed and stable  Last Vitals:  Vitals Value Taken Time  BP    Temp    Pulse 88 03/11/2018  2:03 PM  Resp 14 03/11/2018  2:03 PM  SpO2 95 % 03/11/2018  2:03 PM  Vitals shown include unvalidated device data.  Last Pain:  Vitals:   03/11/18 0625  TempSrc:   PainSc: 0-No pain      Patients Stated Pain Goal: 4 (71/85/50 1586)  Complications: No apparent anesthesia complications

## 2018-03-11 NOTE — Anesthesia Procedure Notes (Signed)
Procedure Name: Intubation Date/Time: 03/11/2018 7:41 AM Performed by: Genelle Bal, CRNA Pre-anesthesia Checklist: Patient identified, Emergency Drugs available, Suction available and Patient being monitored Patient Re-evaluated:Patient Re-evaluated prior to induction Oxygen Delivery Method: Circle system utilized Preoxygenation: Pre-oxygenation with 100% oxygen Induction Type: IV induction Ventilation: Mask ventilation without difficulty and Oral airway inserted - appropriate to patient size Laryngoscope Size: Sabra Heck and 2 Grade View: Grade I Tube type: Oral Tube size: 7.0 mm Number of attempts: 1 Airway Equipment and Method: Stylet and Oral airway Placement Confirmation: ETT inserted through vocal cords under direct vision,  positive ETCO2 and breath sounds checked- equal and bilateral Secured at: 21 cm Tube secured with: Tape Dental Injury: Teeth and Oropharynx as per pre-operative assessment

## 2018-03-11 NOTE — Interval H&P Note (Signed)
History and Physical Interval Note:  03/11/2018 7:08 AM  Kristen Mccoy  has presented today for surgery, with the diagnosis of UTERINE CANCER  The various methods of treatment have been discussed with the patient and family. After consideration of risks, benefits and other options for treatment, the patient has consented to  Procedure(s): XI ROBOTIC ASSISTED TOTAL HYSTERECTOMY WITH BILATERAL SALPINGO OOPHORECTOMY, POSSIBLE LAPAROTOMY WITH STAGING (N/A) SENTINEL NODE BIOPSY (N/A) as a surgical intervention .  The patient's history has been reviewed, patient examined, no change in status, stable for surgery.  I have reviewed the patient's chart and labs.  Questions were answered to the patient's satisfaction.     Isabel Caprice

## 2018-03-11 NOTE — Anesthesia Postprocedure Evaluation (Signed)
Anesthesia Post Note  Patient: Kristen Mccoy  Procedure(s) Performed: XI ROBOTIC ASSISTED TOTAL HYSTERECTOMY WITH BILATERAL SALPINGO OOPHORECTOMY, WITH STAGING (N/A ) SENTINEL NODE BIOPSY (N/A )     Patient location during evaluation: PACU Anesthesia Type: General Level of consciousness: awake and alert Pain management: pain level controlled Vital Signs Assessment: post-procedure vital signs reviewed and stable Respiratory status: spontaneous breathing, nonlabored ventilation, respiratory function stable and patient connected to nasal cannula oxygen Cardiovascular status: blood pressure returned to baseline and stable Postop Assessment: no apparent nausea or vomiting Anesthetic complications: no    Last Vitals:  Vitals:   03/11/18 1605 03/11/18 1605  BP: (!) 142/69 (!) 142/69  Pulse: 82 92  Resp:  14  Temp: 36.7 C 36.7 C  SpO2: 98%     Last Pain:  Vitals:   03/11/18 1605  TempSrc: Oral  PainSc:                  [name]

## 2018-03-12 ENCOUNTER — Encounter (HOSPITAL_COMMUNITY): Payer: Self-pay | Admitting: Obstetrics

## 2018-03-12 DIAGNOSIS — C541 Malignant neoplasm of endometrium: Secondary | ICD-10-CM | POA: Diagnosis not present

## 2018-03-12 LAB — BASIC METABOLIC PANEL
ANION GAP: 7 (ref 5–15)
BUN: 24 mg/dL — ABNORMAL HIGH (ref 6–20)
CALCIUM: 8.4 mg/dL — AB (ref 8.9–10.3)
CO2: 29 mmol/L (ref 22–32)
Chloride: 103 mmol/L (ref 101–111)
Creatinine, Ser: 1.02 mg/dL — ABNORMAL HIGH (ref 0.44–1.00)
GFR, EST NON AFRICAN AMERICAN: 52 mL/min — AB (ref >60)
GLUCOSE: 137 mg/dL — AB (ref 65–99)
Potassium: 4.1 mmol/L (ref 3.5–5.1)
Sodium: 139 mmol/L (ref 135–145)

## 2018-03-12 LAB — CBC
HCT: 33 % — ABNORMAL LOW (ref 36.0–46.0)
Hemoglobin: 10.4 g/dL — ABNORMAL LOW (ref 12.0–15.0)
MCH: 24.9 pg — ABNORMAL LOW (ref 26.0–34.0)
MCHC: 31.5 g/dL (ref 30.0–36.0)
MCV: 79.1 fL (ref 78.0–100.0)
PLATELETS: 244 10*3/uL (ref 150–400)
RBC: 4.17 MIL/uL (ref 3.87–5.11)
RDW: 16.1 % — AB (ref 11.5–15.5)
WBC: 10 10*3/uL (ref 4.0–10.5)

## 2018-03-12 LAB — GLUCOSE, CAPILLARY: Glucose-Capillary: 135 mg/dL — ABNORMAL HIGH (ref 65–99)

## 2018-03-12 MED ORDER — HYDROCODONE-ACETAMINOPHEN 5-325 MG PO TABS: 1.0000 | ORAL_TABLET | ORAL | 0 refills | Status: DC | PRN

## 2018-03-12 MED ORDER — DOCUSATE SODIUM 100 MG PO CAPS: 100.0000 mg | ORAL_CAPSULE | Freq: Two times a day (BID) | ORAL | 0 refills | Status: DC

## 2018-03-12 NOTE — Discharge Summary (Signed)
Physician Discharge Summary  Patient ID: Kristen Mccoy MRN: [id number] DOB/AGE: 06-20-1941 77 y.o.  Admit date: 03/11/2018 Discharge date: 03/12/2018  Admission Diagnoses: Endometrial cancer [facility])  Discharge Diagnoses:  Principal Problem:   Endometrial cancer [facility])   Discharged Condition:  The patient is in good condition and stable for discharge.    Hospital Course: On 03/11/2018, the patient underwent the following: Procedure(s): XI ROBOTIC ASSISTED TOTAL HYSTERECTOMY WITH BILATERAL SALPINGO OOPHORECTOMY, WITH STAGING SENTINEL NODE BIOPSY.  The postoperative course was uneventful.  She was discharged to home on postoperative day 1 tolerating a regular diet, ambulating, pain controlled, voiding.  Consults: None  Significant Diagnostic Studies: None  Treatments: surgery: see above  Discharge Exam: Blood pressure 118/64, pulse 86, temperature 98.2 F (36.8 C), temperature source Oral, resp. rate 18, height 5\' 1"  (1.549 m), weight 113 lb (51.3 kg), SpO2 100 %. General appearance: alert, cooperative and no distress Resp: clear to auscultation bilaterally Cardio: regular rate and rhythm, S1, S2 normal, no murmur, click, rub or gallop GI: soft, active bowel sounds, slightly tender, non-distended Extremities: extremities normal, atraumatic, no cyanosis or edema Incision/Wound: Lap sites to the abdomen with dermabond without drainage  Disposition: Discharge disposition: 01-Home or Self Care       Discharge Instructions    Call MD for:  difficulty breathing, headache or visual disturbances   Complete by:  As directed    Call MD for:  extreme fatigue   Complete by:  As directed    Call MD for:  hives   Complete by:  As directed    Call MD for:  persistant dizziness or light-headedness   Complete by:  As directed    Call MD for:  persistant nausea and vomiting   Complete by:  As directed    Call MD for:  redness, tenderness, or signs of infection (pain, swelling, redness,  odor or green/yellow discharge around incision site)   Complete by:  As directed    Call MD for:  severe uncontrolled pain   Complete by:  As directed    Call MD for:  temperature >100.4   Complete by:  As directed    Diet - low sodium heart healthy   Complete by:  As directed    Driving Restrictions   Complete by:  As directed    No driving for 2 weeks if cleared to drive before surgery.  Do not take narcotics and drive.   Increase activity slowly   Complete by:  As directed    Lifting restrictions   Complete by:  As directed    No lifting greater than 10 lbs.   Sexual Activity Restrictions   Complete by:  As directed    No sexual activity, nothing in the vagina, for 12 weeks.     Allergies as of 03/12/2018      Reactions   Aspirin Itching, Rash   swollen   Penicillins Other (See Comments)   RECEIVED cephalosporin in OR 03/11/18 without incident PCN no problem as a child, but then as adult "my mouth went sideway"      Medication List    TAKE these medications   amLODipine 5 MG tablet Commonly known as:  NORVASC Take 5 mg by mouth daily.   atorvastatin 20 MG tablet Commonly known as:  LIPITOR Take 20 mg by mouth daily.   dicyclomine 20 MG tablet Commonly known as:  BENTYL Take 1 tablet (20 mg total) by mouth every 6 (six) hours as needed for  spasms (abdominal cramping).   docusate sodium 100 MG capsule Commonly known as:  COLACE Take 1 capsule (100 mg total) by mouth 2 (two) times daily.   hydrochlorothiazide 25 MG tablet Commonly known as:  HYDRODIURIL Take 25 mg by mouth daily.   HYDROcodone-acetaminophen 5-325 MG tablet Commonly known as:  NORCO/VICODIN Take 1 tablet by mouth every 4 (four) hours as needed for severe pain.   hydroxypropyl methylcellulose / hypromellose 2.5 % ophthalmic solution Commonly known as:  ISOPTO TEARS / GONIOVISC Place 1 drop into both eyes at bedtime.   metFORMIN 500 MG tablet Commonly known as:  GLUCOPHAGE Take 500 mg by  mouth 2 (two) times daily with a meal.   sitaGLIPtin 50 MG tablet Commonly known as:  JANUVIA Take 50 mg by mouth daily.   VITAMIN B-2 PO Take 125 mg by mouth daily.   Vitamin D (Ergocalciferol) 50000 units Caps capsule Commonly known as:  DRISDOL Take 50,000 Units by mouth every 7 (seven) days. Takes on Thursdays.      Follow-up Information    Isabel Caprice, MD Follow up on 03/23/2018.   Specialty:  Gynecologic Oncology Why:  at 9:30am at the [facility] for post-op follow up Contact information: [address] [place] [address] [phone]           Greater than thirty minutes were spend for face to face discharge instructions and discharge orders/summary in EPIC.   Signed: Dorothyann Gibbs 03/12/2018, 8:46 AM

## 2018-03-12 NOTE — Plan of Care (Signed)
Pt alert and oriented resting with minimal complaints of pain.  Pain well controlled with PO pain meds. Plan to d/c per MD order today once voiding and ambulating in hall.  RN will monitor.

## 2018-03-12 NOTE — Progress Notes (Signed)
Discharge paperwork discussed with pt.  Pt ambulated in hall, voided, and pain was well controlled with PO pain meds.  Pt demonstrated understanding.  Pt was escorted by wheelchair in stable condition to main lobby.

## 2018-03-12 NOTE — Discharge Instructions (Addendum)
03/12/2018  Return to work: 6-8 weeks if applicable  Activity: 1. Be up and out of the bed during the day.  Take a nap if needed.  You may walk up steps but be careful and use the hand rail.  Stair climbing will tire you more than you think, you may need to stop part way and rest.   2. No lifting or straining for 6 weeks.  3. No driving for 2 week(s).  Do not drive if you are taking narcotic pain medicine.  4. Shower daily.  Use soap and water on your incision and pat dry; don't rub.  No tub baths until cleared by your surgeon.   5. No sexual activity and nothing in the vagina for 12 weeks.  6. You may experience a small amount of clear drainage from your incisions, which is normal.  If the drainage persists or increases, please call the office.  7. You may experience vaginal spotting after surgery or around the 6-8 week mark from surgery when the stitches at the top of the vagina begin to dissolve.  The spotting is normal but if you experience heavy bleeding, call our office.  8. Take Tylenol first for pain and only use Hydrocodone/APAP for severe pain not relieved by the Tylenol.  Monitor your Tylenol intake to a max of 4,000 mg a day since Hydrocodone/APAP has Tylenol in it as well. Avoid use of ibuprofen at this time due to slightly elevated kidney function labs.  Make sure you drink plenty of fluids and stay hydrated.  Diet: 1. Low sodium Heart Healthy Diet is recommended.  2. It is safe to use a laxative, such as Miralax or Colace, if you have difficulty moving your bowels. You can take Sennakot at bedtime every evening to keep bowel movements regular and to prevent constipation.    Wound Care: 1. Keep clean and dry.  Shower daily.  Reasons to call the Doctor:  Fever - Oral temperature greater than 100.4 degrees Fahrenheit  Foul-smelling vaginal discharge  Difficulty urinating  Nausea and vomiting  Increased pain at the site of the incision that is unrelieved with pain  medicine.  Difficulty breathing with or without chest pain  New calf pain especially if only on one side  Sudden, continuing increased vaginal bleeding with or without clots.   Contacts: For questions or concerns you should contact:  Dr. Precious Haws at [place], [place] at [phone]  After Hours: call [phone] and have the GYN Oncologist paged/contacted  Acetaminophen; Hydrocodone tablets or capsules What is this medicine? ACETAMINOPHEN; HYDROCODONE (a set a MEE noe fen; hye droe KOE done) is a pain reliever. It is used to treat moderate to severe pain. This medicine may be used for other purposes; ask your health care provider or pharmacist if you have questions. COMMON BRAND NAME(S): Anexsia, Bancap HC, Ceta-Plus, Co-Gesic, Comfortpak, Dolagesic, [employer], DuoCet, Hydrocet, Hydrogesic, [place], Lorcet HD, Lorcet Plus, Lortab, Margesic H, Maxidone, [place], Polygesic, [place], [place], [place], Vicodin, Vicodin ES, Vicodin HP, Charlane Ferretti What should I tell my health care provider before I take this medicine? They need to know if you have any of these conditions: -brain tumor -Crohn's disease, inflammatory bowel disease, or ulcerative colitis -drug abuse or addiction -head injury -heart or circulation problems -if you often drink alcohol -kidney disease or problems going to the bathroom -liver disease -lung disease, asthma, or breathing problems -an unusual or allergic reaction to acetaminophen, hydrocodone, other opioid analgesics, other medicines, foods, dyes,  or preservatives -pregnant or trying to get pregnant -breast-feeding How should I use this medicine? Take this medicine by mouth with a glass of water. Follow the directions on the prescription label. You can take it with or without food. If it upsets your stomach, take it with food. Do not take your medicine more often than directed. A special MedGuide will be given to you by the  pharmacist with each prescription and refill. Be sure to read this information carefully each time. Talk to your pediatrician regarding the use of this medicine in children. Special care may be needed. Overdosage: If you think you have taken too much of this medicine contact a poison control center or emergency room at once. NOTE: This medicine is only for you. Do not share this medicine with others. What if I miss a dose? If you miss a dose, take it as soon as you can. If it is almost time for your next dose, take only that dose. Do not take double or extra doses. What may interact with this medicine? This medicine may interact with the following medications: -alcohol -antiviral medicines for HIV or AIDS -atropine -antihistamines for allergy, cough and cold -certain antibiotics like erythromycin, clarithromycin -certain medicines for anxiety or sleep -certain medicines for bladder problems like oxybutynin, tolterodine -certain medicines for depression like amitriptyline, fluoxetine, sertraline -certain medicines for fungal infections like ketoconazole and itraconazole -certain medicines for Parkinson's disease like benztropine, trihexyphenidyl -certain medicines for seizures like carbamazepine, phenobarbital, phenytoin, primidone -certain medicines for stomach problems like dicyclomine, hyoscyamine -certain medicines for travel sickness like scopolamine -general anesthetics like halothane, isoflurane, methoxyflurane, propofol -ipratropium -local anesthetics like lidocaine, pramoxine, tetracaine -MAOIs like Carbex, Eldepryl, Marplan, Nardil, and Parnate -medicines that relax muscles for surgery -other medicines with acetaminophen -other narcotic medicines for pain or cough -phenothiazines like chlorpromazine, mesoridazine, prochlorperazine, thioridazine -rifampin This list may not describe all possible interactions. Give your health care provider a list of all the medicines, herbs,  non-prescription drugs, or dietary supplements you use. Also tell them if you smoke, drink alcohol, or use illegal drugs. Some items may interact with your medicine. What should I watch for while using this medicine? Tell your doctor or health care professional if your pain does not go away, if it gets worse, or if you have new or a different type of pain. You may develop tolerance to the medicine. Tolerance means that you will need a higher dose of the medicine for pain relief. Tolerance is normal and is expected if you take the medicine for a long time. Do not suddenly stop taking your medicine because you may develop a severe reaction. Your body becomes used to the medicine. This does NOT mean you are addicted. Addiction is a behavior related to getting and using a drug for a non-medical reason. If you have pain, you have a medical reason to take pain medicine. Your doctor will tell you how much medicine to take. If your doctor wants you to stop the medicine, the dose will be slowly lowered over time to avoid any side effects. There are different types of narcotic medicines (opiates). If you take more than one type at the same time or if you are taking another medicine that also causes drowsiness, you may have more side effects. Give your health care provider a list of all medicines you use. Your doctor will tell you how much medicine to take. Do not take more medicine than directed. Call emergency for help if you have problems  breathing or unusual sleepiness. Do not take other medicines that contain acetaminophen with this medicine. Always read labels carefully. If you have questions, ask your doctor or pharmacist. If you take too much acetaminophen get medical help right away. Too much acetaminophen can be very dangerous and cause liver damage. Even if you do not have symptoms, it is important to get help right away. You may get drowsy or dizzy. Do not drive, use machinery, or do anything that needs  mental alertness until you know how this medicine affects you. Do not stand or sit up quickly, especially if you are an older patient. This reduces the risk of dizzy or fainting spells. Alcohol may interfere with the effect of this medicine. Avoid alcoholic drinks. The medicine will cause constipation. Try to have a bowel movement at least every 2 to 3 days. If you do not have a bowel movement for 3 days, call your doctor or health care professional. Your mouth may get dry. Chewing sugarless gum or sucking hard candy, and drinking plenty of water may help. Contact your doctor if the problem does not go away or is severe. What side effects may I notice from receiving this medicine? Side effects that you should report to your doctor or health care professional as soon as possible: -allergic reactions like skin rash, itching or hives, swelling of the face, lips, or tongue -breathing problems -confusion -redness, blistering, peeling or loosening of the skin, including inside the mouth -signs and symptoms of low blood pressure like dizziness; feeling faint or lightheaded, falls; unusually weak or tired -trouble passing urine or change in the amount of urine -yellowing of the eyes or skin Side effects that usually do not require medical attention (report to your doctor or health care professional if they continue or are bothersome): -constipation -dry mouth -nausea, vomiting -tiredness This list may not describe all possible side effects. Call your doctor for medical advice about side effects. You may report side effects to FDA at 1-800-FDA-1088. Where should I keep my medicine? Keep out of the reach of children. This medicine can be abused. Keep your medicine in a safe place to protect it from theft. Do not share this medicine with anyone. Selling or giving away this medicine is dangerous and against the law. This medicine may cause accidental overdose and death if it taken by other adults, children,  or pets. Mix any unused medicine with a substance like cat litter or coffee grounds. Then throw the medicine away in a sealed container like a sealed bag or a coffee can with a lid. Do not use the medicine after the expiration date. Store at room temperature between 15 and 30 degrees C (59 and 86 degrees F). NOTE: This sheet is a summary. It may not cover all possible information. If you have questions about this medicine, talk to your doctor, pharmacist, or health care provider.  2018 Elsevier/Gold Standard (2015-06-01 10:02:16)

## 2018-03-16 ENCOUNTER — Telehealth: Payer: Self-pay

## 2018-03-16 NOTE — Telephone Encounter (Signed)
Discussed with niece Jonelle Sidle that Ms Mctighe maybe having n/v due to not having a BM for 5 days since surgery. Pt in a house with a fan only for the heat and not taking in ~ eight 8 ounce glasses  of fluid a day. Told Ms Redmond Pulling to have aunt increase her colace to bid.  Pick up some Miralax and take a cap full tonight and 1 cap full in the am if no good bowel evacuation. Increase fluid intake as above.   Ms Adel  Is taking 1 hydrocodone 5-325 mg daily for pain.   Will f/u with niece tomorrow to see how Ms Blackwelder is doing regarding BM.

## 2018-03-17 NOTE — Telephone Encounter (Signed)
Spoke with Kristen Mccoy.  She said that her bowels are moving.  She feels much better.

## 2018-03-22 NOTE — Progress Notes (Signed)
Progress Note : GYN-ONC Established Patient FOLLOW-UP / Early Postop  Consult was originally requested by Dr. Nat Math  CC:  Chief Complaint  Patient presents with  . Endometrial mass    HPI: Ms. Kristen Mccoy  is a very nice 77 y.o. P6 (with 2 living children)  . Interval History Since her last visit she underwent surgery on 03/11/18 as follows: Robotic-assisted TLH/BSO / Sentinel lymph node injection and biopsy/ Pelvic washings/  Pelvic lymphadenectomy/ Paraaortic lymphadenectomy There were no perioperative complications.   Final pathology revealed with pertinent highlights bolded: 1. 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 - CARCINOMA INVADES FOR A DEPTH OF 1.3 CM WHERE MYOMETRIAL THICKNESS IS 1.8 CM (GREATER THAN 50%). - UTERINE SEROSA NOT INVOLVED. - NEGATIVE FOR LYMPHOVASCULAR INVASION. - BENIGN BILATERAL FALLOPIAN TUBES AND OVARIES. - SEE ONCOLOGY TABLE. - SEE NOTE 2. Lymph node, sentinel, biopsy, left external iliac - LYMPH NODE WITH ISOLATED TUMOR CELLS (ITC) - IMMUNOHISTOCHEMICAL STAIN FOR PANKERATIN IS POSITIVE FOR ISOLATED TUMOR CELLS 3. Lymph node, sentinel, biopsy, left obturator - LYMPH NODE, NEGATIVE FOR CARCINOMA (0/1). - IMMUNOHISTOCHEMICAL STAIN FOR PANKERATIN IS NEGATIVE 4. Lymph node, sentinel, biopsy, left proximal external iliac - LYMPH NODE, NEGATIVE FOR CARCINOMA (0/1). - IMMUNOHISTOCHEMICAL STAIN FOR PANKERATIN IS NEGATIVE 5. Lymph node, sentinel, biopsy, right obturator - LYMPH NODE, NEGATIVE FOR CARCINOMA (0/1). - IMMUNOHISTOCHEMICAL STAIN FOR PANKERATIN IS NEGATIVE 6. Lymph node, sentinel, biopsy, right proximal external iliac - LYMPH NODE, NEGATIVE FOR CARCINOMA (0/1). - IMMUNOHISTOCHEMICAL STAIN FOR PANKERATIN IS NEGATIVE 7. Lymph nodes, regional resection, left pelvic - FIVE LYMPH  NODES, NEGATIVE FOR CARCINOMA (0/5). 8. Lymph nodes, regional resection, right pelvic - THREE LYMPH NODES, NEGATIVE FOR CARCINOMA (0/3). 9. Lymph node, sentinel, biopsy, presacral - LYMPH NODE, NEGATIVE FOR CARCINOMA (0/1). IMMUNOHISTOCHEMICAL STAIN FOR PANKERATIN IS NEGATIVE 10. Lymph node, biopsy, right peri-aortic - LYMPH NODE, NEGATIVE FOR CARCINOMA (0/1). 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  Today we are going to review the treatment plan in the context of the above pathology. She returns for an early postoperative check and review of the pathology.   She is doing well. She is having normal bowel and bladder function. Denies fever, N/V, and pain controlled. Has dysuria. A little drainage at umbilicus.  . Presenting History (Oncologic Course if applicable) Patient went to the emergency room complaining of pelvic pain states she had CT imaging and was told everything was normal.  She then followed up at [facility] complaining of abdominal pain, nausea, and painful BM. 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  She then followed up with Dr. Adah Perl.  Both a Pap smear and endometrial biopsy were obtained.  Both show adenocarcinoma.  The endometrial biopsy 02/24/2018 states the tissue is poorly differentiated.  We did have the cytology addended looking for HPV and that is noted to be HPV high risk not detected.  The patient is therefore referred for management.  Surprisingly she denied postmenopausal bleeding until her endometrial biopsy was performed.  She denies nausea and vomiting denies changes in bowel or bladder function.  She does note a 6 pound weight loss over the past 6 months.  Positive for early satiety.  Positive for loss of appetite.  She  has pelvic pain that can be as severe as 10 out of 10 this is been present for 3 weeks.  I reviewed her PCN allergy. I do not believe this is a true allergy to penicillin. States she had it when she was younger without problem. We gave her cephalosporin based antibiotic in the OR 03/11/18  Oncologic History:    No history exists.    Measurement of disease:  . Will be clinical exam and imaging PRN  Radiology:   CXR 03/10/18 NED  02/15/18 - CTAP - Lower chest: No acute abnormality. Hepatobiliary: No focal liver abnormality is seen. Diffuse low attenuation of the liver as can be seen with hepatic steatosis. No gallstones, gallbladder wall thickening, or biliary dilatation. Pancreas: Unremarkable. No pancreatic ductal dilatation or surrounding inflammatory changes. Spleen: Normal in size without focal abnormality. Adrenals/Urinary Tract: Normal adrenal glands. Stable right renal cysts. No solid renal mass. No urolithiasis or obstructive uropathy. Normal bladder. Stomach/Bowel: Stomach is within normal limits. No evidence of bowel wall thickening, distention, or inflammatory changes. Vascular/Lymphatic: Normal caliber abdominal aorta with mild atherosclerosis. No lymphadenopathy. Reproductive: No adnexal mass. 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 Other: No fluid collection or hematoma. Musculoskeletal: No acute osseous abnormality. No aggressive osseous lesion. Mild osteoarthritis of bilateral sacroiliac joints, right worse than left  Current Meds:  Outpatient Encounter Medications as of 03/23/2018  Medication Sig  . amLODipine (NORVASC) 5 MG tablet Take 5 mg by mouth daily.  Marland Kitchen atorvastatin (LIPITOR) 20 MG tablet Take 20 mg by mouth daily.  Marland Kitchen dicyclomine (BENTYL) 20 MG tablet Take 1 tablet (20 mg total) by mouth every 6 (six) hours as needed for spasms (abdominal cramping).  Marland Kitchen docusate sodium (COLACE) 100 MG  capsule Take 1 capsule (100 mg total) by mouth 2 (two) times daily.  . hydrochlorothiazide (HYDRODIURIL) 25 MG tablet Take 25 mg by mouth daily.  Marland Kitchen HYDROcodone-acetaminophen (NORCO/VICODIN) 5-325 MG tablet Take 1 tablet by mouth every 4 (four) hours as needed for severe pain.  . hydroxypropyl methylcellulose / hypromellose (ISOPTO TEARS / GONIOVISC) 2.5 % ophthalmic solution Place 1 drop into both eyes at bedtime.  . metFORMIN (GLUCOPHAGE) 500 MG tablet Take 500 mg by mouth 2 (two) times daily with a meal.   . Riboflavin (VITAMIN B-2 PO) Take 125 mg by mouth daily.  . sitaGLIPtin (JANUVIA) 50 MG tablet Take 50 mg by mouth daily.  . Vitamin D, Ergocalciferol, (DRISDOL) 50000 UNITS CAPS capsule Take 50,000 Units by mouth every 7 (seven) days. Takes on Thursdays.   No facility-administered encounter medications on file as of 03/23/2018.     Allergy:  Allergies  Allergen Reactions  . Aspirin Itching and Rash    swollen   . Penicillins Other (See Comments)    RECEIVED cephalosporin in OR 03/11/18 without incident PCN no problem as a child, but then as adult "my mouth went sideway"    Social Hx:  Tobacco use: None Alcohol use: None Illicit Drug use: None Illicit IV Drug use: None  Past Surgical Hx:  Past Surgical History:  Procedure Laterality Date  . ABDOMINAL HYSTERECTOMY     total hysterectomy Dr. Gerarda Fraction 03-11-18  . APPENDECTOMY     Open HOWEVER incision is in RUQ  . COLONOSCOPY N/A 12/14/2013   Procedure: COLONOSCOPY;  Surgeon: Rogene Houston, MD;  Location: [facility];  Service: Endoscopy;  Laterality:  N/A;  100  . ESOPHAGOGASTRODUODENOSCOPY N/A 12/26/2015   Procedure: ESOPHAGOGASTRODUODENOSCOPY (EGD);  Surgeon: Rogene Houston, MD;  Location: [facility];  Service: Endoscopy;  Laterality: N/A;  2:45  . ROBOTIC ASSISTED TOTAL HYSTERECTOMY WITH BILATERAL SALPINGO OOPHERECTOMY N/A 03/11/2018   Procedure: XI ROBOTIC ASSISTED TOTAL HYSTERECTOMY WITH BILATERAL SALPINGO OOPHORECTOMY,  WITH STAGING;  Surgeon: Isabel Caprice, MD;  Location: [facility];  Service: Gynecology;  Laterality: N/A;  . SENTINEL NODE BIOPSY N/A 03/11/2018   Procedure: SENTINEL NODE BIOPSY;  Surgeon: Isabel Caprice, MD;  Location: [facility];  Service: Gynecology;  Laterality: N/A;  . TUBAL LIGATION      Past Medical Hx:  Past Medical History:  Diagnosis Date  . Cancer ([place])    uterine  . Diabetes ([place])     type 2  . Hypertension    x 5 yr  . Pneumonia     Past Gynecological History:   GYNECOLOGIC HISTORY:  No LMP recorded. Patient is postmenopausal. Menarche: 77 years old P 6016 (only has 2 living children) LMP 50 Contraceptive< 10 years oral contraceptive HRT none  Last Pap states 2017 by PCP and was called and told it was normal  Family Hx:  Family History  Problem Relation Age of Onset  . Diabetes Mother   . Diabetes Brother   . Colon cancer Neg Hx     Review of Systems:  Review of Systems  Genitourinary: Positive for dysuria.   Skin: Positive for wound.  All other systems reviewed and are negative.   Vitals:  Blood pressure (!) 137/55, pulse 88, temperature 98.3 F (36.8 C), temperature source Oral, resp. rate 20, height 5\' 1"  (1.549 m), weight 110 lb 3.2 oz (50 kg), SpO2 99 %. Body mass index is 20.82 kg/m.   Physical Exam:  ECOG PERFORMANCE STATUS: 0 - Asymptomatic  General :  Well developed, 77 y.o., female in no apparent distress HEENT:  Normocephalic/atraumatic, symmetric, EOMI, eyelids normal Neck:   No visible masses.  Respiratory:  Respirations unlabored, no use of accessory muscles CV:   Deferred Breast:  Deferred Musculoskeletal: Normal muscle strength. Abdomen:  Trocar sites are CDI. The umbilical Dermabond has come off, but no drainage. No visible masses or protrusion Extremities:  No visible edema or deformities Skin:   Normal inspection Neuro/Psych:  No focal motor deficit, no abnormal mental status. Normal gait. Normal affect. Alert and oriented  to person, place, and time    Oncologic Summary: Stage 2 G3 EM Uterine CA with +ITC on sentinel lymph node x 1.  Assessment/Plan:  1. Counseling for uterine cancer ? She has ITC in one of the lymph nodes.  ? I will present her case at Tumor Conference, but have indicated to her this may increase our concern for recurrence, but we have no data to alter treatment recommendations because of this.  ? She had full nodal dissection in addition to the sentinel nodes. ? Therefore I will not recommend chemotherapy ? Given the cervical stromal involvement I will recommend radiation with EBRT to the pelvis and brachytherapy to the vagina 2. RTC ~ one month to confirm above recommendations and for a vaginal cuff check 3. In the meantime we will make a referral to Radiation Oncology so treatment discussions can be started, however I do not anticipate treatment until she is 6 weeks or so from her surgery. ? I have asked the office if there is RTx closer to home for her and if so will refer her  accordingly given the treatment will be daily M-F. Ideally it should be through a facility though that offers HDR so she is not getting treated in 2 systems. 4. Dysuria - check urine   Isabel Caprice, MD  03/23/2018, 11:01 AM  Cc: Nat Math, MD (Referring OB/GYN) Russella Dar, MD (PCP)

## 2018-03-23 ENCOUNTER — Inpatient Hospital Stay: Payer: Medicare HMO

## 2018-03-23 ENCOUNTER — Inpatient Hospital Stay: Payer: Medicare HMO | Attending: Obstetrics | Admitting: Obstetrics

## 2018-03-23 ENCOUNTER — Encounter: Payer: Self-pay | Admitting: Obstetrics

## 2018-03-23 ENCOUNTER — Encounter: Payer: Self-pay | Admitting: Oncology

## 2018-03-23 VITALS — BP 137/55 | HR 88 | Temp 98.3°F | Resp 20 | Ht 61.0 in | Wt 110.2 lb

## 2018-03-23 DIAGNOSIS — Z90710 Acquired absence of both cervix and uterus: Secondary | ICD-10-CM | POA: Diagnosis not present

## 2018-03-23 DIAGNOSIS — R300 Dysuria: Secondary | ICD-10-CM | POA: Insufficient documentation

## 2018-03-23 DIAGNOSIS — C541 Malignant neoplasm of endometrium: Secondary | ICD-10-CM

## 2018-03-23 DIAGNOSIS — Z90722 Acquired absence of ovaries, bilateral: Secondary | ICD-10-CM | POA: Insufficient documentation

## 2018-03-23 DIAGNOSIS — N9489 Other specified conditions associated with female genital organs and menstrual cycle: Secondary | ICD-10-CM

## 2018-03-23 LAB — URINALYSIS, COMPLETE (UACMP) WITH MICROSCOPIC
Bilirubin Urine: NEGATIVE
Glucose, UA: NEGATIVE mg/dL
Ketones, ur: 5 mg/dL — AB
NITRITE: NEGATIVE
Protein, ur: NEGATIVE mg/dL
SPECIFIC GRAVITY, URINE: 1.013 (ref 1.005–1.030)
pH: 7 (ref 5.0–8.0)

## 2018-03-23 NOTE — Patient Instructions (Addendum)
1. We will refer you to Radiation Oncology. 2. Return to see me in one month to check the vagina 3. Continued activity restrictions until return 4. Urine will be checked today

## 2018-03-25 LAB — URINE CULTURE

## 2018-03-26 NOTE — Progress Notes (Signed)
GYN Location of Tumor / Histology: endometrial / Kristen Mccoy presented with symptoms of: Patient went to the emergency room complaining of pelvic pain states she had CT imaging and was told everything was normal.  She then followed up at [facility] complaining of abdominal pain, nausea, and painful BM. 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  She then followed up with Dr. Adah Perl.  Both a Pap smear and endometrial biopsy were obtained.  Both show adenocarcinoma.  The endometrial biopsy 02/24/2018 states the tissue is poorly differentiated.  We did have the cytology addended looking for HPV and that is noted to be HPV high risk not detected.    Biopsies revealed: 1. 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 - CARCINOMA INVADES FOR A DEPTH OF 1.3 CM WHERE MYOMETRIAL THICKNESS IS 1.8 CM (GREATER THAN 50%). - UTERINE SEROSA NOT INVOLVED. - NEGATIVE FOR LYMPHOVASCULAR INVASION. - BENIGN BILATERAL FALLOPIAN TUBES AND OVARIES. - SEE ONCOLOGY TABLE. - SEE NOTE 2. Lymph node, sentinel, biopsy, left external iliac - LYMPH NODE WITH ISOLATED TUMOR CELLS (ITC) - IMMUNOHISTOCHEMICAL STAIN FOR PANKERATIN IS POSITIVE FOR ISOLATED TUMOR CELLS  Past/Anticipated interventions by Gyn/Onc surgery, if any: 03/11/2018 Dr. Gerarda Fraction:  Operation:  1. Robotic-assisted laparoscopic total hysterectomy with bilateral salpingoophorectomy 2. Sentinel lymph node injection and biopsy 3. Pelvic washings 4. Pelvic lymphadenectomy 5. Paraaortic lymphadenectomy    Past/Anticipated interventions by medical oncology, if any: None at this time.  Weight changes, if any: Pt reports a 12 pound weight loss since having surgery.  Bowel/Bladder complaints, if any: Pt is currently  being treated for a UTI and is on antibiotic. Pt states dysuria has resolved. Pt reports constipation.   Nausea/Vomiting, if any: Pt states nausea and vomiting started yesterday. Pt denies abdominal bloating.   Pain issues, if any:  Pt denies c/o pain associated with cancer.  SAFETY ISSUES:  Prior radiation? No  Pacemaker/ICD? No  Possible current pregnancy? No. Post-surgical  Is the patient on methotrexate? No  Current Complaints / other details:  Pt presents today for initial consult with Dr. Sondra Come for Radiation Oncology. Pt is accompanied by niece and granddaughter. Pt reports N/V of one day duration, started yesterday. Kristen Sousa, RN BSN

## 2018-03-29 ENCOUNTER — Other Ambulatory Visit: Payer: Self-pay | Admitting: Gynecologic Oncology

## 2018-03-29 ENCOUNTER — Telehealth: Payer: Self-pay

## 2018-03-29 DIAGNOSIS — N390 Urinary tract infection, site not specified: Secondary | ICD-10-CM

## 2018-03-29 MED ORDER — SULFAMETHOXAZOLE-TRIMETHOPRIM 800-160 MG PO TABS: 1.0000 | ORAL_TABLET | Freq: Two times a day (BID) | ORAL | 0 refills | Status: DC

## 2018-03-29 NOTE — Telephone Encounter (Signed)
Told Kristen Mccoy that the urine culture from 03-23-18 showed that she has a bladder infection. Dr. Gerarda Fraction sent in Bactrim  1 tab bid for 3 days to [facility]. Pt verbalized understanding.

## 2018-04-01 ENCOUNTER — Ambulatory Visit
Admission: RE | Admit: 2018-04-01 | Discharge: 2018-04-01 | Disposition: A | Discharge status: Home / Self Care | Payer: Medicare HMO | Source: Ambulatory Visit | Attending: Radiation Oncology | Admitting: Radiation Oncology

## 2018-04-01 ENCOUNTER — Encounter: Payer: Self-pay | Admitting: Radiation Oncology

## 2018-04-01 ENCOUNTER — Other Ambulatory Visit: Payer: Self-pay

## 2018-04-01 ENCOUNTER — Encounter: Payer: Self-pay | Admitting: General Practice

## 2018-04-01 ENCOUNTER — Encounter: Payer: Self-pay | Admitting: Oncology

## 2018-04-01 VITALS — BP 134/54 | HR 88 | Temp 97.8°F | Resp 16 | Wt 108.8 lb

## 2018-04-01 DIAGNOSIS — Z79899 Other long term (current) drug therapy: Secondary | ICD-10-CM | POA: Insufficient documentation

## 2018-04-01 DIAGNOSIS — C541 Malignant neoplasm of endometrium: Secondary | ICD-10-CM | POA: Diagnosis present

## 2018-04-01 DIAGNOSIS — C779 Secondary and unspecified malignant neoplasm of lymph node, unspecified: Secondary | ICD-10-CM | POA: Diagnosis not present

## 2018-04-01 DIAGNOSIS — I10 Essential (primary) hypertension: Secondary | ICD-10-CM | POA: Diagnosis not present

## 2018-04-01 DIAGNOSIS — E119 Type 2 diabetes mellitus without complications: Secondary | ICD-10-CM | POA: Diagnosis not present

## 2018-04-01 MED ORDER — ONDANSETRON HCL 4 MG PO TABS: 4.0000 mg | ORAL_TABLET | Freq: Three times a day (TID) | ORAL | 0 refills | Status: DC | PRN

## 2018-04-01 NOTE — Progress Notes (Unsigned)
[facility] Psychosocial Distress Screening Clinical Social Work  Clinical Social Work was referred by distress screening protocol.  The patient scored a 6 on the Psychosocial Distress Thermometer which indicates moderate distress. Clinical Social Worker contacted patient by phone to assess for distress and other psychosocial needs. No appetite in the morning, has been vomiting for past two days.  MD aware.  Lives w son who takes good care of her and helps w transportation.  Explained availability of support center here in [place], [place] support group and [place].  Will mail information packet.  Patient asked for [facility] staff to call her to explain their programs.    ONCBCN DISTRESS SCREENING 04/01/2018  Screening Type Initial Screening  Distress experienced in past week (1-10) 6  Practical problem type Food  Emotional problem type Nervousness/Anxiety  Physical Problem type Nausea/vomiting;Loss of appetitie    Clinical Social Worker follow up needed: No.  If yes, follow up plan:  Beverely Pace, [place], LCSW Clinical Social Worker Phone:  [phone]

## 2018-04-01 NOTE — Progress Notes (Addendum)
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 female who is accompanied by her niece and granddaughter. She lives in [place], but her niece lives in [place]. She presented to the ER with abdominal pain on 02/15/18 and received a CT scan. . She then saw Dr. Gerarda Fraction on 03/08/18 for consultation and underwent robotic assisted total laparoscopic hysterectomy and bilateral oophorectomy, pelvic lymphadenectomy, periaortic lymphadenectomy, pelvic washings on 03/11/18.   The CT abdomen pelvis w contrast on 02/15/2018 revealed: 1. No acute abdominal or pelvic pathology. 2. 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  The pathology from her surgery on 03/11/18 showed: 1. 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 - carcinoma invades for a depth of 1.3 cm where myometrial thickness if 1.8 cm (greater than 50%) - uterine serosa not involved - negative for lymphovascular invasion - benign bilateral fallopian tubes and ovaries 2. Lymph node, sentinel, biopsy, left external iliac - lymph node with isolated tumor cells (ITC) - immunohistochemical stain for pankeratin is positive for isolated tumor cells. 14 other lymph nodes were examined and reported negative.  She's taking hydrocodone for post-surgery pain as needed. She denies post-surgical bleeding and pain, but reports leg pain she believes is arthritis. She  reports loss of appetite and throwing up all day yesterday.    PREVIOUS RADIATION THERAPY: No  PAST MEDICAL HISTORY:  has a past medical history of Cancer ([place]), Diabetes ([place]), Hypertension, and Pneumonia.    PAST SURGICAL HISTORY: Past Surgical History:  Procedure Laterality Date  . ABDOMINAL HYSTERECTOMY     total hysterectomy Dr. Gerarda Fraction 03-11-18  . APPENDECTOMY     Open HOWEVER incision is in RUQ  . COLONOSCOPY N/A 12/14/2013   Procedure: COLONOSCOPY;  Surgeon: Rogene Houston, MD;  [facility];  Service: Endoscopy;  Laterality: N/A;  100  . ESOPHAGOGASTRODUODENOSCOPY N/A 12/26/2015   Procedure: ESOPHAGOGASTRODUODENOSCOPY (EGD);  Surgeon: Rogene Houston, MD;  [facility];  Service: Endoscopy;  Laterality: N/A;  2:45  . ROBOTIC ASSISTED TOTAL HYSTERECTOMY WITH BILATERAL SALPINGO OOPHERECTOMY N/A 03/11/2018   Procedure: XI ROBOTIC ASSISTED TOTAL HYSTERECTOMY WITH BILATERAL SALPINGO OOPHORECTOMY, WITH STAGING;  Surgeon: Isabel Caprice, MD;  Location: [facility];  Service: Gynecology;  Laterality: N/A;  . SENTINEL NODE BIOPSY N/A 03/11/2018   Procedure: SENTINEL NODE BIOPSY;  Surgeon: Isabel Caprice, MD;  Location: [facility];  Service: Gynecology;  Laterality: N/A;  . TUBAL LIGATION      FAMILY HISTORY: family history includes Diabetes in her brother and mother.  SOCIAL HISTORY:  reports that she has never smoked. She has never used smokeless tobacco. She reports that she does not drink alcohol or use drugs.  ALLERGIES: Aspirin and Penicillins  MEDICATIONS:  Current Outpatient Medications  Medication Sig Dispense Refill  . amLODipine (NORVASC) 5 MG tablet Take 5 mg by mouth daily.    Marland Kitchen atorvastatin (LIPITOR) 20 MG tablet Take 20 mg by mouth daily.    Marland Kitchen  dicyclomine (BENTYL) 20 MG tablet Take 1 tablet (20 mg total) by mouth every 6 (six) hours as needed for spasms (abdominal cramping). 15 tablet 0  . docusate sodium (COLACE) 100 MG capsule Take 1 capsule (100 mg  total) by mouth 2 (two) times daily. 30 capsule 0  . hydrochlorothiazide (HYDRODIURIL) 25 MG tablet Take 25 mg by mouth daily.    Marland Kitchen HYDROcodone-acetaminophen (NORCO/VICODIN) 5-325 MG tablet Take 1 tablet by mouth every 4 (four) hours as needed for severe pain. 20 tablet 0  . hydroxypropyl methylcellulose / hypromellose (ISOPTO TEARS / GONIOVISC) 2.5 % ophthalmic solution Place 1 drop into both eyes at bedtime.    . metFORMIN (GLUCOPHAGE) 500 MG tablet Take 500 mg by mouth 2 (two) times daily with a meal.     . Riboflavin (VITAMIN B-2 PO) Take 125 mg by mouth daily.    . sitaGLIPtin (JANUVIA) 50 MG tablet Take 50 mg by mouth daily.    Marland Kitchen sulfamethoxazole-trimethoprim (BACTRIM DS,SEPTRA DS) 800-160 MG tablet Take 1 tablet by mouth 2 (two) times daily. 6 tablet 0  . Vitamin D, Ergocalciferol, (DRISDOL) 50000 UNITS CAPS capsule Take 50,000 Units by mouth every 7 (seven) days. Takes on Thursdays.    Glory Rosebush DELICA LANCETS 85Y MISC     . ONETOUCH VERIO test strip      No current facility-administered medications for this encounter.     REVIEW OF SYSTEMS: REVIEW OF SYSTEMS: A 10+ POINT REVIEW OF SYSTEMS WAS OBTAINED including neurology, dermatology, psychiatry, cardiac, respiratory, lymph, extremities, GI, GU, musculoskeletal, constitutional, reproductive, HEENT. All pertinent positives are noted in the HPI. All others are negative.  PHYSICAL EXAM:  weight is 108 lb 12.8 oz (49.4 kg). Her oral temperature is 97.8 F (36.6 C). Her blood pressure is 134/54 (abnormal) and her pulse is 88. Her respiration is 16 and oxygen saturation is 100%.   General: Alert and oriented, in no acute distress HEENT: Head is normocephalic. Extraocular movements are intact. Oropharynx is clear. Neck: Neck is supple, no palpable cervical or supraclavicular lymphadenopathy. Heart: Regular in rate and rhythm with no murmurs, rubs, or gallops. Chest: Clear to auscultation bilaterally, with no rhonchi, wheezes, or  rales. Abdomen: Soft, nontender, nondistended, with no rigidity or guarding. Laparoscopic scars healing well without signs of drainage or infection. No appreciable abdominal distention noted. Extremities: No cyanosis or edema. Lymphatics: see Neck Exam Skin: No concerning lesions. Musculoskeletal: symmetric strength and muscle tone throughout. Neurologic: Cranial nerves II through XII are grossly intact. No obvious focalities. Speech is fluent. Coordination is intact. Psychiatric: Judgment and insight are intact. Affect is appropriate. Pelvic exam was deferred in light of her recent surgery.  ECOG = 1  0 - Asymptomatic (Fully active, able to carry on all predisease activities without restriction)  1 - Symptomatic but completely ambulatory (Restricted in physically strenuous activity but ambulatory and able to carry out work of a light or sedentary nature. For example, light housework, office work)  2 - Symptomatic, <50% in bed during the day (Ambulatory and capable of all self care but unable to carry out any work activities. Up and about more than 50% of waking hours)  3 - Symptomatic, >50% in bed, but not bedbound (Capable of only limited self-care, confined to bed or chair 50% or more of waking hours)  4 - Bedbound (Completely disabled. Cannot carry on any self-care. Totally confined to bed or chair)  5 - Death   Eustace Pen MM, Creech RH, Tormey DC, et al. [phone]). "  Toxicity and response criteria of the [facility] Group". [place] Oncol. 5 (6): 649-55  LABORATORY DATA:  Lab Results  Component Value Date   WBC 10.0 03/12/2018   HGB 10.4 (L) 03/12/2018   HCT 33.0 (L) 03/12/2018   MCV 79.1 03/12/2018   PLT 244 03/12/2018   NEUTROABS 4.4 02/15/2018   Lab Results  Component Value Date   NA 139 03/12/2018   K 4.1 03/12/2018   CL 103 03/12/2018   CO2 29 03/12/2018   GLUCOSE 137 (H) 03/12/2018   CREATININE 1.02 (H) 03/12/2018   CALCIUM 8.4 (L) 03/12/2018       RADIOGRAPHY: Dg Chest 2 View  Result Date: 03/10/2018 CLINICAL DATA:  Preoperative evaluation for upcoming hysterectomy EXAM: CHEST - 2 VIEW COMPARISON:  03/28/2015 FINDINGS: Cardiac shadow is stable. Lungs are well aerated bilaterally. No focal infiltrate or sizable effusion is seen. Degenerative changes of the thoracic spine are noted. IMPRESSION: No acute abnormality noted. Electronically Signed   By: Inez Catalina M.D.   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 A she would be at risk for recurrence and I would agree with Dr. Pete Pelt recommendations for postoperative radiation therapy.  Given the cervical stromal involvement and deeply invasive tumor,  I would agree with recommendation for radiation with EBRT to the pelvis and brachytherapy to the vagina given the significant risk for vaginal cuff recurrence.  Today, I talked to the patient and family about the findings and work-up thus far.  We discussed the natural history of endometrial cancer and general treatment, highlighting the role of radiotherapy in the management.  We discussed the available radiation techniques, and focused on the details of logistics and delivery.  We reviewed the anticipated acute and late sequelae associated with radiation in this setting.  The patient was encouraged to ask questions that I answered to the best of my ability.  A patient consent form was discussed and signed.  We retained a copy for our records.  The patient would like to proceed with radiation and will be scheduled for CT simulation.   PLAN:the patient will be seen by Dr. Gerarda Fraction on August 5 for detailed pelvic examination. Assuming the patient has had good healing she will proceed with CT simulation on August 6 with treatments to begin approximately a week later. She will receive 45 gray in 25 fractions to the pelvis followed by 3 intracavitary radiotherapy treatments with  iridium 192 as the high-dose-rate source. For the patient's external beam radiation treatment I am recommending intensity modulated radiation therapy to reduce dose to the small bowel and pelvic bony structures.  I will prescribe zofran for nausea., Compazine not recommended for elderly patients.    ------------------------------------------------  Blair Promise, PhD, MD  This document serves as a record of services personally performed by Gery Pray, MD. It was created on his behalf by Wilburn Mylar, a trained medical scribe. The creation of this record is based on the scribe's personal observations and the provider's statements to them. This document has been checked and approved by the attending provider.

## 2018-04-12 ENCOUNTER — Encounter: Payer: Self-pay | Admitting: Gynecologic Oncology

## 2018-04-12 NOTE — Progress Notes (Signed)
Gynecologic Oncology Multi-Disciplinary Disposition Conference Note  Date of the Conference: April 12, 2018  Patient Name: Kristen Mccoy  Referring Provider: Dr. Adah Perl Primary GYN Oncologist: Dr. 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  Disposition is to external beam radiation to the pelvis and vaginal brachytherapy.   This Multidisciplinary conference took place involving physicians from [place], [place], Radiation Oncology, Pathology, Radiology along with the Gynecologic Oncology Nurse Practitioner and RN.  Comprehensive assessment of the patient's malignancy, staging, need for surgery, chemotherapy, radiation therapy, and need for further testing were reviewed. Supportive measures, both inpatient and following discharge were also discussed. The recommended plan of care is documented. Greater than 35 minutes were spent correlating and coordinating this patient's care.

## 2018-04-26 ENCOUNTER — Inpatient Hospital Stay: Payer: Medicare HMO | Attending: Obstetrics | Admitting: Obstetrics

## 2018-04-26 ENCOUNTER — Encounter: Payer: Self-pay | Admitting: Obstetrics

## 2018-04-26 VITALS — BP 141/54 | HR 98 | Temp 98.2°F | Resp 20 | Ht 61.0 in | Wt 113.5 lb

## 2018-04-26 DIAGNOSIS — C774 Secondary and unspecified malignant neoplasm of inguinal and lower limb lymph nodes: Secondary | ICD-10-CM | POA: Insufficient documentation

## 2018-04-26 DIAGNOSIS — Z90710 Acquired absence of both cervix and uterus: Secondary | ICD-10-CM | POA: Insufficient documentation

## 2018-04-26 DIAGNOSIS — Z90722 Acquired absence of ovaries, bilateral: Secondary | ICD-10-CM | POA: Insufficient documentation

## 2018-04-26 DIAGNOSIS — C541 Malignant neoplasm of endometrium: Secondary | ICD-10-CM | POA: Insufficient documentation

## 2018-04-26 NOTE — Progress Notes (Signed)
Progress Note : GYN-ONC Established Patient FOLLOW-UP / Early Postop  Consult was originally requested by Dr. Nat Math  CC:  Chief Complaint  Patient presents with  . Endometrial mass    HPI: Ms. Kristen Mccoy  is a very nice 77 y.o. P6 (with 2 living children)  . Interval History Since her last visit she underwent surgery on 03/11/18 as follows: Robotic-assisted TLH/BSO / Sentinel lymph node injection and biopsy/ Pelvic washings/  Pelvic lymphadenectomy/ Paraaortic lymphadenectomy There were no perioperative complications.   Final pathology revealed with pertinent highlights bolded: 1. 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 - CARCINOMA INVADES FOR A DEPTH OF 1.3 CM WHERE MYOMETRIAL THICKNESS IS 1.8 CM (GREATER THAN 50%). - UTERINE SEROSA NOT INVOLVED. - NEGATIVE FOR LYMPHOVASCULAR INVASION. - BENIGN BILATERAL FALLOPIAN TUBES AND OVARIES. - SEE ONCOLOGY TABLE. - SEE NOTE 2. Lymph node, sentinel, biopsy, left external iliac - LYMPH NODE WITH ISOLATED TUMOR CELLS (ITC) - IMMUNOHISTOCHEMICAL STAIN FOR PANKERATIN IS POSITIVE FOR ISOLATED TUMOR CELLS 3. Lymph node, sentinel, biopsy, left obturator - LYMPH NODE, NEGATIVE FOR CARCINOMA (0/1). - IMMUNOHISTOCHEMICAL STAIN FOR PANKERATIN IS NEGATIVE 4. Lymph node, sentinel, biopsy, left proximal external iliac - LYMPH NODE, NEGATIVE FOR CARCINOMA (0/1). - IMMUNOHISTOCHEMICAL STAIN FOR PANKERATIN IS NEGATIVE 5. Lymph node, sentinel, biopsy, right obturator - LYMPH NODE, NEGATIVE FOR CARCINOMA (0/1). - IMMUNOHISTOCHEMICAL STAIN FOR PANKERATIN IS NEGATIVE 6. Lymph node, sentinel, biopsy, right proximal external iliac - LYMPH NODE, NEGATIVE FOR CARCINOMA (0/1). - IMMUNOHISTOCHEMICAL STAIN FOR PANKERATIN IS NEGATIVE 7. Lymph nodes, regional resection, left pelvic - FIVE LYMPH  NODES, NEGATIVE FOR CARCINOMA (0/5). 8. Lymph nodes, regional resection, right pelvic - THREE LYMPH NODES, NEGATIVE FOR CARCINOMA (0/3). 9. Lymph node, sentinel, biopsy, presacral - LYMPH NODE, NEGATIVE FOR CARCINOMA (0/1). IMMUNOHISTOCHEMICAL STAIN FOR PANKERATIN IS NEGATIVE 10. Lymph node, biopsy, right peri-aortic - LYMPH NODE, NEGATIVE FOR CARCINOMA (0/1). 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  She returns for a vaginal cuff check. She is doing well. She is having normal bowel and bladder function. Denies fever, N/V, and pain controlled.   . Presenting History (Oncologic Course if applicable) Patient went to the emergency room complaining of pelvic pain states she had CT imaging and was told everything was normal.  She then followed up at [facility] complaining of abdominal pain, nausea, and painful BM. 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  She then followed up with Dr. Adah Perl.  Both a Pap smear and endometrial biopsy were obtained.  Both show adenocarcinoma.  The endometrial biopsy 02/24/2018 states the tissue is poorly differentiated.  We did have the cytology addended looking for HPV and that is noted to be HPV high risk not detected.  The patient is therefore referred for management.  Surprisingly she denied postmenopausal bleeding until her endometrial biopsy was performed.  She denies nausea and vomiting denies changes in bowel or bladder function.  She does note a 6 pound weight loss over the past 6 months.  Positive for early satiety.  Positive for loss of appetite.  She has pelvic pain that can be as severe as 10 out of 10 this is been present  for 3 weeks.  I reviewed her PCN allergy. I do not believe this is a true allergy to  penicillin. States she had it when she was younger without problem. We gave her cephalosporin based antibiotic in the OR 03/11/18  Oncologic History:    No history exists.    Measurement of disease:  . Will be clinical exam and imaging PRN  Radiology:   CXR 03/10/18 NED  02/15/18 - CTAP - Lower chest: No acute abnormality. Hepatobiliary: No focal liver abnormality is seen. Diffuse low attenuation of the liver as can be seen with hepatic steatosis. No gallstones, gallbladder wall thickening, or biliary dilatation. Pancreas: Unremarkable. No pancreatic ductal dilatation or surrounding inflammatory changes. Spleen: Normal in size without focal abnormality. Adrenals/Urinary Tract: Normal adrenal glands. Stable right renal cysts. No solid renal mass. No urolithiasis or obstructive uropathy. Normal bladder. Stomach/Bowel: Stomach is within normal limits. No evidence of bowel wall thickening, distention, or inflammatory changes. Vascular/Lymphatic: Normal caliber abdominal aorta with mild atherosclerosis. No lymphadenopathy. Reproductive: No adnexal mass. 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 Other: No fluid collection or hematoma. Musculoskeletal: No acute osseous abnormality. No aggressive osseous lesion. Mild osteoarthritis of bilateral sacroiliac joints, right worse than left  Current Meds:  Outpatient Encounter Medications as of 04/26/2018  Medication Sig  . amLODipine (NORVASC) 5 MG tablet Take 5 mg by mouth daily.  Marland Kitchen atorvastatin (LIPITOR) 20 MG tablet Take 20 mg by mouth daily.  Marland Kitchen dicyclomine (BENTYL) 20 MG tablet Take 1 tablet (20 mg total) by mouth every 6 (six) hours as needed for spasms (abdominal cramping).  Marland Kitchen docusate sodium (COLACE) 100 MG capsule Take 1 capsule (100 mg total) by mouth 2 (two) times daily.  . hydrochlorothiazide (HYDRODIURIL) 25 MG tablet Take 25 mg by mouth daily.  . hydroxypropyl  methylcellulose / hypromellose (ISOPTO TEARS / GONIOVISC) 2.5 % ophthalmic solution Place 1 drop into both eyes at bedtime.  . metFORMIN (GLUCOPHAGE) 500 MG tablet Take 500 mg by mouth 2 (two) times daily with a meal.   . ONETOUCH DELICA LANCETS 10C MISC   . ONETOUCH VERIO test strip   . Riboflavin (VITAMIN B-2 PO) Take 125 mg by mouth daily.  . sitaGLIPtin (JANUVIA) 50 MG tablet Take 50 mg by mouth daily.  . Vitamin D, Ergocalciferol, (DRISDOL) 50000 UNITS CAPS capsule Take 50,000 Units by mouth every 7 (seven) days. Takes on Thursdays.  . [DISCONTINUED] HYDROcodone-acetaminophen (NORCO/VICODIN) 5-325 MG tablet Take 1 tablet by mouth every 4 (four) hours as needed for severe pain. (Patient not taking: Reported on 04/26/2018)  . [DISCONTINUED] ondansetron (ZOFRAN) 4 MG tablet Take 1 tablet (4 mg total) by mouth every 8 (eight) hours as needed for nausea or vomiting. (Patient not taking: Reported on 04/26/2018)  . [DISCONTINUED] sulfamethoxazole-trimethoprim (BACTRIM DS,SEPTRA DS) 800-160 MG tablet Take 1 tablet by mouth 2 (two) times daily. (Patient not taking: Reported on 04/26/2018)   No facility-administered encounter medications on file as of 04/26/2018.     Allergy:  Allergies  Allergen Reactions  . Aspirin Itching and Rash    swollen   . Penicillins Other (See Comments)    RECEIVED cephalosporin in OR 03/11/18 without incident PCN no problem as a child, but then as adult "my mouth went sideway"    Social Hx:  Tobacco use: None Alcohol use: None Illicit Drug use: None Illicit IV Drug use: None  Past Surgical Hx:  Past Surgical History:  Procedure Laterality Date  .  ABDOMINAL HYSTERECTOMY     total hysterectomy Dr. Gerarda Fraction 03-11-18  . APPENDECTOMY     Open HOWEVER incision is in RUQ  . COLONOSCOPY N/A 12/14/2013   Procedure: COLONOSCOPY;  Surgeon: Rogene Houston, MD;  [facility];  Service: Endoscopy;  Laterality: N/A;  100  . ESOPHAGOGASTRODUODENOSCOPY N/A 12/26/2015    Procedure: ESOPHAGOGASTRODUODENOSCOPY (EGD);  Surgeon: Rogene Houston, MD;  [facility];  Service: Endoscopy;  Laterality: N/A;  2:45  . ROBOTIC ASSISTED TOTAL HYSTERECTOMY WITH BILATERAL SALPINGO OOPHERECTOMY N/A 03/11/2018   Procedure: XI ROBOTIC ASSISTED TOTAL HYSTERECTOMY WITH BILATERAL SALPINGO OOPHORECTOMY, WITH STAGING;  Surgeon: Isabel Caprice, MD;  Location[facility];  Service: Gynecology;  Laterality: N/A;  . SENTINEL NODE BIOPSY N/A 03/11/2018   Procedure: SENTINEL NODE BIOPSY;  Surgeon: Isabel Caprice, MD;  Location: [facility];  Service: Gynecology;  Laterality: N/A;  . TUBAL LIGATION      Past Medical Hx:  Past Medical History:  Diagnosis Date  . Arthritis   . Cancer ([place])    uterine  . Diabetes ([place])     type 2  . Hypertension    x 5 yr  . Pneumonia     Past Gynecological History:   GYNECOLOGIC HISTORY:  No LMP recorded. Patient is postmenopausal. Menarche: 77 years old P 6016 (only has 2 living children) LMP 50 Contraceptive< 10 years oral contraceptive HRT none  Last Pap states 2017 by PCP and was called and told it was normal  Family Hx:  Family History  Problem Relation Age of Onset  . Diabetes Mother   . Diabetes Brother   . Colon cancer Neg Hx     Review of Systems:  Review of Systems  Respiratory: Positive for cough.   Musculoskeletal: Positive for arthralgias.  All other systems reviewed and are negative.   Vitals:  Blood pressure (!) 141/54, pulse 98, temperature 98.2 F (36.8 C), temperature source Oral, resp. rate 20, height 5' 1"  (1.549 m), weight 113 lb 8 oz (51.5 kg), SpO2 97 %. Body mass index is 21.45 kg/m.   Physical Exam:  ECOG PERFORMANCE STATUS: 0 - Asymptomatic  General :  Well developed, 77 y.o., female in no apparent distress HEENT:  Normocephalic/atraumatic, symmetric, EOMI, eyelids normal Neck:   No visible masses.  Respiratory:  Respirations unlabored, no use of accessory muscles CV:   Deferred Breast:   Deferred Musculoskeletal: Normal muscle strength. Abdomen:  Trocar sites are healed. No visible masses or protrusion Extremities:  No visible edema or deformities Skin:   Normal inspection Neuro/Psych:  No focal motor deficit, no abnormal mental status. Normal gait. Normal affect. Alert and oriented to person, place, and time  Genito Urinary: Vulva: Normal external female genitalia.  Bladder/urethra: Urethral meatus normal in size and location. No lesions or   masses, well supported bladder Speculum exam: Vagina: No lesion, no discharge, no bleeding. Vaginal cuff intact. Shortened vagina Bimanual exam: Cervix/Uterus/Adnexa: Surgically absent  Adnexa: No masses. Rectovaginal:  Good tone, no masses, no cul de sac nodularity, no parametrial involvement or nodularity.    Oncologic Summary: Stage 2 G3 EM Uterine CA with +ITC on sentinel lymph node x 1. MMR/MSI normal  Assessment/Plan:  1. Counseling for uterine cancer ? She has ITC in one of the lymph nodes.  ? We discussed previously this may increase our concern for recurrence, but we have no data to alter treatment recommendations because of this.  ? She had full nodal dissection in addition  to the sentinel nodes. ? Therefore I will not recommend chemotherapy ? Given the cervical stromal involvement I recommended radiation with EBRT to the pelvis and brachytherapy to the vagina ? She was presented at Multidisciplinary Tumor Board which agreed with above 2. RTC ~ 71month to assess for vaginal dilator therapy / stenosis risk   SIsabel Caprice MD  04/26/2018, 12:08 PM  Cc: DNat Math MD (Referring OB/GYN) ARussella Dar MD (PCP) JGery Pray MD (Radiation Oncology)

## 2018-04-26 NOTE — Patient Instructions (Signed)
1. Return in 2 months to review radiation atrophy and possible vaginal dilator review

## 2018-04-27 ENCOUNTER — Ambulatory Visit
Admission: RE | Admit: 2018-04-27 | Discharge: 2018-04-27 | Disposition: A | Discharge status: Home / Self Care | Payer: Medicare HMO | Source: Ambulatory Visit | Attending: Radiation Oncology | Admitting: Radiation Oncology

## 2018-04-27 ENCOUNTER — Telehealth: Payer: Self-pay

## 2018-04-27 DIAGNOSIS — Z510 Encounter for antineoplastic radiation therapy: Secondary | ICD-10-CM | POA: Insufficient documentation

## 2018-04-27 DIAGNOSIS — C541 Malignant neoplasm of endometrium: Secondary | ICD-10-CM | POA: Diagnosis not present

## 2018-04-27 NOTE — Progress Notes (Signed)
  Radiation Oncology         [phone] ________________________________  Name: Kristen Mccoy MRN: [id number]  Date: 04/27/2018  DOB: [birth year]  SIMULATION AND TREATMENT PLANNING NOTE    ICD-10-CM   1. Endometrial cancer ([place]) C54.1     DIAGNOSIS:  Stage II, grade 3 endometrial cancer with isolated tumor cells on sentinel node [pT2,pN0(i+)]    NARRATIVE:  The patient was brought to the [facility].  Identity was confirmed.  All relevant records and images related to the planned course of therapy were reviewed.  The patient freely provided informed written consent to proceed with treatment after reviewing the details related to the planned course of therapy. The consent form was witnessed and verified by the simulation staff.  Then, the patient was set-up in a stable reproducible  supine position for radiation therapy.  CT images were obtained.  Surface markings were placed.  The CT images were loaded into the planning software.  Then the target and avoidance structures were contoured.  Treatment planning then occurred.  The radiation prescription was entered and confirmed.  Then, I designed and supervised the construction of a total of 2 medically necessary complex treatment devices.  I have requested : Intensity Modulated Radiotherapy (IMRT) is medically necessary for this case for the following reason:  Small bowel sparing..  I have ordered:dose calc.  PLAN:  The patient will receive 45 Gy in 25 fractions followed by a boost to the vaginal cuff with intracavitary brachytherapy treatments. The patient will receive 3 high-dose rate brachytherapy treatments directed at the vaginal cuff with iridium 192 as the high-dose-rate source.  -----------------------------------  Blair Promise, PhD, MD

## 2018-04-27 NOTE — Telephone Encounter (Signed)
Per Dr. Sondra Come, pt will not need IV contrast for CT simulation. Contacted CT Sim to convey sim to be done without IV contrast. Contacted pt that arrival time for pt would be 1400 in Radiation Oncology waiting. Conveyed to pt that Dr. Sondra Come had reviewed images and labs and had decided to perform CT sim without IV contrast. Pt verbalized understanding. Loma Sousa, RN BSN

## 2018-04-29 DIAGNOSIS — Z510 Encounter for antineoplastic radiation therapy: Secondary | ICD-10-CM | POA: Diagnosis not present

## 2018-05-06 ENCOUNTER — Ambulatory Visit
Admission: RE | Admit: 2018-05-06 | Discharge: 2018-05-06 | Disposition: A | Discharge status: Home / Self Care | Payer: Medicare HMO | Source: Ambulatory Visit | Attending: Radiation Oncology | Admitting: Radiation Oncology

## 2018-05-06 DIAGNOSIS — Z510 Encounter for antineoplastic radiation therapy: Secondary | ICD-10-CM | POA: Diagnosis not present

## 2018-05-07 ENCOUNTER — Ambulatory Visit
Admission: RE | Admit: 2018-05-07 | Discharge: 2018-05-07 | Disposition: A | Discharge status: Home / Self Care | Payer: Medicare HMO | Source: Ambulatory Visit | Attending: Radiation Oncology | Admitting: Radiation Oncology

## 2018-05-07 DIAGNOSIS — Z510 Encounter for antineoplastic radiation therapy: Secondary | ICD-10-CM | POA: Diagnosis not present

## 2018-05-10 ENCOUNTER — Ambulatory Visit
Admission: RE | Admit: 2018-05-10 | Discharge: 2018-05-10 | Disposition: A | Discharge status: Home / Self Care | Payer: Medicare HMO | Source: Ambulatory Visit | Attending: Radiation Oncology | Admitting: Radiation Oncology

## 2018-05-10 DIAGNOSIS — Z510 Encounter for antineoplastic radiation therapy: Secondary | ICD-10-CM | POA: Diagnosis not present

## 2018-05-11 ENCOUNTER — Ambulatory Visit
Admission: RE | Admit: 2018-05-11 | Discharge: 2018-05-11 | Disposition: A | Discharge status: Home / Self Care | Payer: Medicare HMO | Source: Ambulatory Visit | Attending: Radiation Oncology | Admitting: Radiation Oncology

## 2018-05-11 DIAGNOSIS — Z510 Encounter for antineoplastic radiation therapy: Secondary | ICD-10-CM | POA: Diagnosis not present

## 2018-05-11 NOTE — Progress Notes (Signed)
Pt here for patient teaching.  Pt given Radiation and You booklet and skin care instructions.  Reviewed areas of pertinence such as diarrhea, fatigue, hair loss, nausea and vomiting, skin changes and urinary and bladder changes . Pt able to give teach back of use unscented/gentle soap and have Imodium on hand,. Pt demonstrated understanding, needs reinforcement, no evidence of learning, refused teaching and  of information given and will contact nursing with any questions or concerns.     [email or web address]

## 2018-05-12 ENCOUNTER — Ambulatory Visit
Admission: RE | Admit: 2018-05-12 | Discharge: 2018-05-12 | Disposition: A | Discharge status: Home / Self Care | Payer: Medicare HMO | Source: Ambulatory Visit | Attending: Radiation Oncology | Admitting: Radiation Oncology

## 2018-05-12 DIAGNOSIS — Z510 Encounter for antineoplastic radiation therapy: Secondary | ICD-10-CM | POA: Diagnosis not present

## 2018-05-13 ENCOUNTER — Ambulatory Visit
Admission: RE | Admit: 2018-05-13 | Discharge: 2018-05-13 | Disposition: A | Discharge status: Home / Self Care | Payer: Medicare HMO | Source: Ambulatory Visit | Attending: Radiation Oncology | Admitting: Radiation Oncology

## 2018-05-13 DIAGNOSIS — Z510 Encounter for antineoplastic radiation therapy: Secondary | ICD-10-CM | POA: Diagnosis not present

## 2018-05-14 ENCOUNTER — Ambulatory Visit
Admission: RE | Admit: 2018-05-14 | Discharge: 2018-05-14 | Disposition: A | Discharge status: Home / Self Care | Payer: Medicare HMO | Source: Ambulatory Visit | Attending: Radiation Oncology | Admitting: Radiation Oncology

## 2018-05-14 DIAGNOSIS — Z510 Encounter for antineoplastic radiation therapy: Secondary | ICD-10-CM | POA: Diagnosis not present

## 2018-05-17 ENCOUNTER — Ambulatory Visit
Admission: RE | Admit: 2018-05-17 | Discharge: 2018-05-17 | Disposition: A | Discharge status: Home / Self Care | Payer: Medicare HMO | Source: Ambulatory Visit | Attending: Radiation Oncology | Admitting: Radiation Oncology

## 2018-05-17 DIAGNOSIS — Z510 Encounter for antineoplastic radiation therapy: Secondary | ICD-10-CM | POA: Diagnosis not present

## 2018-05-18 ENCOUNTER — Ambulatory Visit
Admission: RE | Admit: 2018-05-18 | Discharge: 2018-05-18 | Disposition: A | Discharge status: Home / Self Care | Payer: Medicare HMO | Source: Ambulatory Visit | Attending: Radiation Oncology | Admitting: Radiation Oncology

## 2018-05-18 DIAGNOSIS — Z510 Encounter for antineoplastic radiation therapy: Secondary | ICD-10-CM | POA: Diagnosis not present

## 2018-05-19 ENCOUNTER — Ambulatory Visit
Admission: RE | Admit: 2018-05-19 | Discharge: 2018-05-19 | Disposition: A | Discharge status: Home / Self Care | Payer: Medicare HMO | Source: Ambulatory Visit | Attending: Radiation Oncology | Admitting: Radiation Oncology

## 2018-05-19 DIAGNOSIS — Z510 Encounter for antineoplastic radiation therapy: Secondary | ICD-10-CM | POA: Diagnosis not present

## 2018-05-20 ENCOUNTER — Ambulatory Visit
Admission: RE | Admit: 2018-05-20 | Discharge: 2018-05-20 | Disposition: A | Discharge status: Home / Self Care | Payer: Medicare HMO | Source: Ambulatory Visit | Attending: Radiation Oncology | Admitting: Radiation Oncology

## 2018-05-20 DIAGNOSIS — Z510 Encounter for antineoplastic radiation therapy: Secondary | ICD-10-CM | POA: Diagnosis not present

## 2018-05-21 ENCOUNTER — Ambulatory Visit
Admission: RE | Admit: 2018-05-21 | Discharge: 2018-05-21 | Disposition: A | Discharge status: Home / Self Care | Payer: Medicare HMO | Source: Ambulatory Visit | Attending: Radiation Oncology | Admitting: Radiation Oncology

## 2018-05-21 DIAGNOSIS — Z510 Encounter for antineoplastic radiation therapy: Secondary | ICD-10-CM | POA: Diagnosis not present

## 2018-05-25 ENCOUNTER — Ambulatory Visit
Admission: RE | Admit: 2018-05-25 | Discharge: 2018-05-25 | Disposition: A | Discharge status: Home / Self Care | Payer: Medicare HMO | Source: Ambulatory Visit | Attending: Radiation Oncology | Admitting: Radiation Oncology

## 2018-05-25 DIAGNOSIS — C541 Malignant neoplasm of endometrium: Secondary | ICD-10-CM | POA: Diagnosis not present

## 2018-05-25 DIAGNOSIS — Z510 Encounter for antineoplastic radiation therapy: Secondary | ICD-10-CM | POA: Diagnosis not present

## 2018-05-26 ENCOUNTER — Ambulatory Visit
Admission: RE | Admit: 2018-05-26 | Discharge: 2018-05-26 | Disposition: A | Discharge status: Home / Self Care | Payer: Medicare HMO | Source: Ambulatory Visit | Attending: Radiation Oncology | Admitting: Radiation Oncology

## 2018-05-26 DIAGNOSIS — Z510 Encounter for antineoplastic radiation therapy: Secondary | ICD-10-CM | POA: Diagnosis not present

## 2018-05-27 ENCOUNTER — Ambulatory Visit
Admission: RE | Admit: 2018-05-27 | Discharge: 2018-05-27 | Disposition: A | Discharge status: Home / Self Care | Payer: Medicare HMO | Source: Ambulatory Visit | Attending: Radiation Oncology | Admitting: Radiation Oncology

## 2018-05-27 DIAGNOSIS — Z510 Encounter for antineoplastic radiation therapy: Secondary | ICD-10-CM | POA: Diagnosis not present

## 2018-05-28 ENCOUNTER — Ambulatory Visit
Admission: RE | Admit: 2018-05-28 | Discharge: 2018-05-28 | Disposition: A | Discharge status: Home / Self Care | Payer: Medicare HMO | Source: Ambulatory Visit | Attending: Radiation Oncology | Admitting: Radiation Oncology

## 2018-05-28 DIAGNOSIS — Z510 Encounter for antineoplastic radiation therapy: Secondary | ICD-10-CM | POA: Diagnosis not present

## 2018-05-31 ENCOUNTER — Ambulatory Visit
Admission: RE | Admit: 2018-05-31 | Discharge: 2018-05-31 | Disposition: A | Discharge status: Home / Self Care | Payer: Medicare HMO | Source: Ambulatory Visit | Attending: Radiation Oncology | Admitting: Radiation Oncology

## 2018-05-31 DIAGNOSIS — Z510 Encounter for antineoplastic radiation therapy: Secondary | ICD-10-CM | POA: Diagnosis not present

## 2018-06-01 ENCOUNTER — Ambulatory Visit
Admission: RE | Admit: 2018-06-01 | Discharge: 2018-06-01 | Disposition: A | Discharge status: Home / Self Care | Payer: Medicare HMO | Source: Ambulatory Visit | Attending: Radiation Oncology | Admitting: Radiation Oncology

## 2018-06-01 DIAGNOSIS — R300 Dysuria: Secondary | ICD-10-CM

## 2018-06-01 DIAGNOSIS — Z510 Encounter for antineoplastic radiation therapy: Secondary | ICD-10-CM | POA: Diagnosis not present

## 2018-06-01 LAB — URINALYSIS, COMPLETE (UACMP) WITH MICROSCOPIC
Bacteria, UA: NONE SEEN
Bilirubin Urine: NEGATIVE
Glucose, UA: NEGATIVE mg/dL
Hgb urine dipstick: NEGATIVE
KETONES UR: NEGATIVE mg/dL
Nitrite: NEGATIVE
PH: 5 (ref 5.0–8.0)
Protein, ur: NEGATIVE mg/dL
SPECIFIC GRAVITY, URINE: 1.016 (ref 1.005–1.030)

## 2018-06-02 ENCOUNTER — Ambulatory Visit
Admission: RE | Admit: 2018-06-02 | Discharge: 2018-06-02 | Disposition: A | Discharge status: Home / Self Care | Payer: Medicare HMO | Source: Ambulatory Visit | Attending: Radiation Oncology | Admitting: Radiation Oncology

## 2018-06-02 DIAGNOSIS — Z510 Encounter for antineoplastic radiation therapy: Secondary | ICD-10-CM | POA: Diagnosis not present

## 2018-06-02 LAB — URINE CULTURE

## 2018-06-03 ENCOUNTER — Ambulatory Visit
Admission: RE | Admit: 2018-06-03 | Discharge: 2018-06-03 | Disposition: A | Discharge status: Home / Self Care | Payer: Medicare HMO | Source: Ambulatory Visit | Attending: Radiation Oncology | Admitting: Radiation Oncology

## 2018-06-03 DIAGNOSIS — Z510 Encounter for antineoplastic radiation therapy: Secondary | ICD-10-CM | POA: Diagnosis not present

## 2018-06-03 NOTE — Progress Notes (Signed)
Patient called.  Left VM on good urine results. This RN's direct number left on VM for any questions/concerns. Loma Sousa, RN BSN

## 2018-06-04 ENCOUNTER — Ambulatory Visit
Admission: RE | Admit: 2018-06-04 | Discharge: 2018-06-04 | Disposition: A | Discharge status: Home / Self Care | Payer: Medicare HMO | Source: Ambulatory Visit | Attending: Radiation Oncology | Admitting: Radiation Oncology

## 2018-06-04 DIAGNOSIS — Z510 Encounter for antineoplastic radiation therapy: Secondary | ICD-10-CM | POA: Diagnosis not present

## 2018-06-07 ENCOUNTER — Ambulatory Visit
Admission: RE | Admit: 2018-06-07 | Discharge: 2018-06-07 | Disposition: A | Discharge status: Home / Self Care | Payer: Medicare HMO | Source: Ambulatory Visit | Attending: Radiation Oncology | Admitting: Radiation Oncology

## 2018-06-07 DIAGNOSIS — Z510 Encounter for antineoplastic radiation therapy: Secondary | ICD-10-CM | POA: Diagnosis not present

## 2018-06-08 ENCOUNTER — Ambulatory Visit
Admission: RE | Admit: 2018-06-08 | Discharge: 2018-06-08 | Disposition: A | Discharge status: Home / Self Care | Payer: Medicare HMO | Source: Ambulatory Visit | Attending: Radiation Oncology | Admitting: Radiation Oncology

## 2018-06-08 ENCOUNTER — Telehealth: Payer: Self-pay | Admitting: Radiation Oncology

## 2018-06-08 DIAGNOSIS — Z510 Encounter for antineoplastic radiation therapy: Secondary | ICD-10-CM | POA: Diagnosis not present

## 2018-06-08 NOTE — Telephone Encounter (Signed)
Talked with Kristen Mccoy to confirm all her HDR tx appts. She was agreeable.

## 2018-06-09 ENCOUNTER — Ambulatory Visit
Admission: RE | Admit: 2018-06-09 | Discharge: 2018-06-09 | Disposition: A | Discharge status: Home / Self Care | Payer: Medicare HMO | Source: Ambulatory Visit | Attending: Radiation Oncology | Admitting: Radiation Oncology

## 2018-06-09 DIAGNOSIS — Z510 Encounter for antineoplastic radiation therapy: Secondary | ICD-10-CM | POA: Diagnosis not present

## 2018-06-10 ENCOUNTER — Ambulatory Visit
Admission: RE | Admit: 2018-06-10 | Discharge: 2018-06-10 | Disposition: A | Discharge status: Home / Self Care | Payer: Medicare HMO | Source: Ambulatory Visit | Attending: Radiation Oncology | Admitting: Radiation Oncology

## 2018-06-10 DIAGNOSIS — Z510 Encounter for antineoplastic radiation therapy: Secondary | ICD-10-CM | POA: Diagnosis not present

## 2018-06-14 ENCOUNTER — Telehealth: Payer: Self-pay | Admitting: *Deleted

## 2018-06-14 NOTE — Telephone Encounter (Signed)
CALLED PATIENT TO REMIND OF NEW HDR [place] FOR 06-15-18, SPOKE WITH PATIENT AND SHE IS AWARE OF THESE APPTS.

## 2018-06-15 ENCOUNTER — Ambulatory Visit
Admission: RE | Admit: 2018-06-15 | Discharge: 2018-06-15 | Disposition: A | Discharge status: Home / Self Care | Payer: Medicare HMO | Source: Ambulatory Visit | Attending: Radiation Oncology | Admitting: Radiation Oncology

## 2018-06-15 ENCOUNTER — Encounter: Payer: Self-pay | Admitting: Radiation Oncology

## 2018-06-15 ENCOUNTER — Other Ambulatory Visit: Payer: Self-pay

## 2018-06-15 VITALS — BP 134/52 | HR 71 | Temp 97.7°F | Resp 19 | Wt 109.4 lb

## 2018-06-15 DIAGNOSIS — Z79899 Other long term (current) drug therapy: Secondary | ICD-10-CM | POA: Diagnosis not present

## 2018-06-15 DIAGNOSIS — C541 Malignant neoplasm of endometrium: Secondary | ICD-10-CM

## 2018-06-15 DIAGNOSIS — Z886 Allergy status to analgesic agent status: Secondary | ICD-10-CM | POA: Insufficient documentation

## 2018-06-15 DIAGNOSIS — Z7984 Long term (current) use of oral hypoglycemic drugs: Secondary | ICD-10-CM | POA: Insufficient documentation

## 2018-06-15 DIAGNOSIS — Z880 Allergy status to penicillin: Secondary | ICD-10-CM | POA: Insufficient documentation

## 2018-06-15 DIAGNOSIS — Z510 Encounter for antineoplastic radiation therapy: Secondary | ICD-10-CM | POA: Diagnosis not present

## 2018-06-15 NOTE — Progress Notes (Signed)
  Radiation Oncology         [phone]) [phone] ________________________________  Name: Kristen Mccoy MRN: [id number]  Date: 06/15/2018  DOB: [birth year]  Vaginal Brachytherapy Procedure Note  CC: Monico Blitz, MD Isabel Caprice, MD    ICD-10-CM   1. Endometrial cancer ([place]) C54.1     Diagnosis:  Stage II, grade 3 endometrial cancer with isolated tumor cells on sentinel node [pT2,pN0(i+)]    Narrative: She returns today for vaginal cylinder fitting. Last week she completed her postoperative external beam radiation therapy totaling 45 gray to the pelvis area. Overall she tolerated her external beam treatment quite well. Patient presents for planning and her first high-dose-rate treatment directed at the vaginal cuff.  ALLERGIES: is allergic to aspirin and penicillins.  Meds: Current Outpatient Medications  Medication Sig Dispense Refill  . amLODipine (NORVASC) 5 MG tablet Take 5 mg by mouth daily.    Marland Kitchen atorvastatin (LIPITOR) 20 MG tablet Take 20 mg by mouth daily.    Marland Kitchen dicyclomine (BENTYL) 20 MG tablet Take 1 tablet (20 mg total) by mouth every 6 (six) hours as needed for spasms (abdominal cramping). 15 tablet 0  . docusate sodium (COLACE) 100 MG capsule Take 1 capsule (100 mg total) by mouth 2 (two) times daily. 30 capsule 0  . hydrochlorothiazide (HYDRODIURIL) 25 MG tablet Take 25 mg by mouth daily.    . hydroxypropyl methylcellulose / hypromellose (ISOPTO TEARS / GONIOVISC) 2.5 % ophthalmic solution Place 1 drop into both eyes at bedtime.    . metFORMIN (GLUCOPHAGE) 500 MG tablet Take 500 mg by mouth 2 (two) times daily with a meal.     . ONETOUCH DELICA LANCETS 38H MISC     . ONETOUCH VERIO test strip     . Riboflavin (VITAMIN B-2 PO) Take 125 mg by mouth daily.    . sitaGLIPtin (JANUVIA) 50 MG tablet Take 50 mg by mouth daily.    . Vitamin D, Ergocalciferol, (DRISDOL) 50000 UNITS CAPS capsule Take 50,000 Units by mouth every 7 (seven) days. Takes on Thursdays.     No current  facility-administered medications for this encounter.     Physical Findings: The patient is in no acute distress. Patient is alert and oriented.  vitals were not taken for this visit.   No palpable cervical, supraclavicular or axillary lymphoadenopathy. The heart has a regular rate and rhythm. The lungs are clear to auscultation. Abdomen soft and non-tender.  On pelvic examination the external genitalia were unremarkable. A speculum exam was performed. Vaginal cuff intact, no mucosal lesions. On bimanual exam there were no pelvic masses appreciated.  Lab Findings: Lab Results  Component Value Date   WBC 10.0 03/12/2018   HGB 10.4 (L) 03/12/2018   HCT 33.0 (L) 03/12/2018   MCV 79.1 03/12/2018   PLT 244 03/12/2018    Radiographic Findings: No results found.  Impression:  Stage II, grade 3 endometrial cancer with isolated tumor cells on sentinel node [pT2,pN0(i+)]  Patient was fitted for a vaginal cylinder. The patient will be treated with a 2.5 cm diameter cylinder with a treatment length of 3.0 cm. This distended the vaginal vault without undue discomfort. The patient tolerated the procedure well.  The patient was successfully fitted for a vaginal cylinder. The patient is appropriate to begin vaginal brachytherapy.   Plan: The patient will proceed with CT simulation and vaginal brachytherapy today.    _______________________________   Blair Promise, PhD, MD

## 2018-06-15 NOTE — Progress Notes (Signed)
  Radiation Oncology         [phone]) [phone] ________________________________  Name: Kristen Mccoy MRN: [id number]  Date: 06/15/2018  DOB: 01/10/1941  CC: Monico Blitz, MD  Isabel Caprice, MD  HDR BRACHYTHERAPY NOTE  DIAGNOSIS: Stage II, grade 3 endometrial cancer with isolated tumor cells on sentinel node [pT2,pN0(i+)]   Simple treatment device note: Patient had construction of her custom vaginal cylinder. She will be treated with a 2.5 cm diameter segmented cylinder. This conforms to her anatomy without undue discomfort.  Vaginal brachytherapy procedure node: The patient was brought to the [facility] suite. Identity was confirmed. All relevant records and images related to the planned course of therapy were reviewed. The patient freely provided informed written consent to proceed with treatment after reviewing the details related to the planned course of therapy. The consent form was witnessed and verified by the simulation staff. Then, the patient was set-up in a stable reproducible supine position for radiation therapy. Pelvic exam revealed the vaginal cuff to be intact . The patient's custom vaginal cylinder was placed in the proximal vagina. This was affixed to the CT/MR stabilization plate to prevent slippage. Patient tolerated the placement well.  Verification simulation note:  A fiducial marker was placed within the vaginal cylinder. An AP and lateral film was then obtained through the pelvis area. This documented accurate position of the vaginal cylinder for treatment.  HDR BRACHYTHERAPY TREATMENT  The remote afterloading device was affixed to the vaginal cylinder by catheter. Patient then proceeded to undergo her first high-dose-rate treatment directed at the proximal vagina. The patient was prescribed a dose of 6.0 gray to be delivered to the mucosal surface. Treatment length was 3.0 cm. Patient was treated with 1 channel using 7 dwell positions. Treatment time was 283.1 seconds. Iridium  192 was the high-dose-rate source for treatment. The patient tolerated the treatment well. After completion of her therapy, a radiation survey was performed documenting return of the iridium source into the [employer] safe.   PLAN: the patient will return next week for her second high-dose-rate treatment ________________________________  Blair Promise, PhD, MD

## 2018-06-15 NOTE — Progress Notes (Signed)
Ms Rohwer is here today  She is a  new start HDR vag cuff. Patient denies any pain or fatigue.Patient denies any vaginal discharge. Patient denies any vaginal or rectal bleeding.States that she has diarrhea ,but she takes imodium and gets relief. States that she has some dysuria. Denies any hematuria. Reports noctuira x2. Vitals:   06/15/18 0942  BP: (!) 134/52  Pulse: 71  Resp: 19  Temp: 97.7 F (36.5 C)  TempSrc: Oral  SpO2: 100%  Weight: 109 lb 6.4 oz (49.6 kg)   Wt Readings from Last 3 Encounters:  06/15/18 109 lb 6.4 oz (49.6 kg)  04/26/18 113 lb 8 oz (51.5 kg)  04/01/18 108 lb 12.8 oz (49.4 kg)

## 2018-06-15 NOTE — Progress Notes (Signed)
  Radiation Oncology         [phone] ________________________________  Name: Kristen Mccoy MRN: [id number]  Date: 06/15/2018  DOB: August 15, 1941  SIMULATION AND TREATMENT PLANNING NOTE HDR BRACHYTHERAPY  DIAGNOSIS:   Stage II, grade 3 endometrial cancer with isolated tumor cells on sentinel node [pT2,pN0(i+)]  NARRATIVE:  The patient was brought to the [facility].  Identity was confirmed.  All relevant records and images related to the planned course of therapy were reviewed.  The patient freely provided informed written consent to proceed with treatment after reviewing the details related to the planned course of therapy. The consent form was witnessed and verified by the simulation staff.  Then, the patient was set-up in a stable reproducible  supine position for radiation therapy.  CT images were obtained.  Surface markings were placed.  The CT images were loaded into the planning software.  Then the target and avoidance structures were contoured.  Treatment planning then occurred.  The radiation prescription was entered and confirmed.   I have requested : Brachytherapy Isodose Plan and Dosimetry Calculations to plan the radiation distribution.    PLAN:  The patient will receive 18 Gy in 3 fractions. The patient will be treated with a 2.5 cm custom segmented cylinder. Prescription will be to the mucosal surface. Iridium 192 will be the high-dose-rate source.    ________________________________  Blair Promise, PhD, MD

## 2018-06-16 ENCOUNTER — Ambulatory Visit: Payer: Medicare HMO | Admitting: Radiation Oncology

## 2018-06-17 ENCOUNTER — Ambulatory Visit: Payer: Medicare HMO | Admitting: Radiation Oncology

## 2018-06-18 ENCOUNTER — Ambulatory Visit: Payer: Medicare HMO | Admitting: Radiation Oncology

## 2018-06-21 ENCOUNTER — Telehealth: Payer: Self-pay | Admitting: *Deleted

## 2018-06-21 ENCOUNTER — Ambulatory Visit: Payer: Medicare HMO | Admitting: Radiation Oncology

## 2018-06-21 NOTE — Telephone Encounter (Signed)
CALLED PATIENT TO REMIND OF HDR TX. FOR 06-22-18 @ 9 AM, SPOKE WITH PATIENT AND SHE IS AWARE OF THIS [place].

## 2018-06-22 ENCOUNTER — Ambulatory Visit
Admission: RE | Admit: 2018-06-22 | Discharge: 2018-06-22 | Disposition: A | Discharge status: Home / Self Care | Payer: Medicare HMO | Source: Ambulatory Visit | Attending: Radiation Oncology | Admitting: Radiation Oncology

## 2018-06-22 DIAGNOSIS — C541 Malignant neoplasm of endometrium: Secondary | ICD-10-CM | POA: Insufficient documentation

## 2018-06-22 NOTE — Progress Notes (Signed)
  Radiation Oncology         [phone] ________________________________  Name: Kristen Mccoy MRN: [id number]  Date: 06/22/2018  DOB: 08/06/1941  CC: Monico Blitz, MD  Isabel Caprice, MD  HDR BRACHYTHERAPY NOTE  DIAGNOSIS:  Stage II, grade 3 endometrial cancer with isolated tumor cells on sentinel node [pT2,pN0(i+)]   Simple treatment device note: Patient had construction of her custom vaginal cylinder. She will be treated with a 2.5 cm diameter segmented cylinder. This conforms to her anatomy without undue discomfort.  Vaginal brachytherapy procedure node: The patient was brought to the [facility] suite. Identity was confirmed. All relevant records and images related to the planned course of therapy were reviewed. The patient freely provided informed written consent to proceed with treatment after reviewing the details related to the planned course of therapy. The consent form was witnessed and verified by the simulation staff. Then, the patient was set-up in a stable reproducible supine position for radiation therapy. Pelvic exam revealed the vaginal cuff to be intact. The patient's custom vaginal cylinder was placed in the proximal vagina. This was affixed to the CT/MR stabilization plate to prevent slippage. Patient tolerated the placement well.  Verification simulation note:  A fiducial marker was placed within the vaginal cylinder. An AP and lateral film was then obtained through the pelvis area. This documented accurate position of the vaginal cylinder for treatment.  HDR BRACHYTHERAPY TREATMENT  The remote afterloading device was affixed to the vaginal cylinder by catheter. Patient then proceeded to undergo her second high-dose-rate treatment directed at the proximal vagina. The patient was prescribed a dose of 6.0 gray to be delivered to the mucosal surface. Treatment length was 3.0 cm. Patient was treated with 1 channel using 7 dwell positions. Treatment time was 302.4 seconds. Iridium  192 was the high-dose-rate source for treatment. The patient tolerated the treatment well. After completion of her therapy, a radiation survey was performed documenting return of the iridium source into the [employer] safe.   PLAN: the patient will return next week for her third and final high-dose rate treatment. ________________________________  Blair Promise, PhD, MD

## 2018-06-24 NOTE — Progress Notes (Signed)
Kristen Mccoy at [facility]   Progress Note : Established Patient Follow-Up  Consult was originally requested by Dr. Nat Math for uterine cancer  CC:  No chief complaint on file.  Gyn Oncologic Summary 1. 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  03/11/18 RA-TLH/BSO/SLNBx/PLND/PALND, Luther Redo  04/2018 - present EBRT 45 Gy and HDR vag brachy x __?5__ 2. MMR normal/MSI stable   HPI: Ms. Kristen Mccoy  is a very nice 77 y.o. P6 (with 2 living children)  . Interval History  Since her last visit early July she has undergone EBRT/Vag brachy adjuvant therapy; completing 2 of a planned 5 HDR treatments so far.  She has no complaints today except for some diarrhea (just having completed EBRT)  . Presenting History (Oncologic Course if applicable) Patient went to the emergency room complaining of pelvic pain states she had CT imaging and was told everything was normal.  She then followed up at [facility] complaining of abdominal pain, nausea, and painful BM. 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  She then followed up with Dr. Adah Perl.  Both a Pap smear and endometrial biopsy were obtained.  Both show adenocarcinoma.  The endometrial biopsy 02/24/2018 states the tissue is poorly differentiated.  We did have the cytology addended looking for HPV and that is noted to be HPV high risk not detected.  The patient is therefore referred for management.  Surprisingly she denied postmenopausal bleeding until her endometrial biopsy was performed.  She denies nausea and vomiting denies changes in bowel or bladder function.  She does note a 6 pound weight loss over the past 6 months.  Positive for early satiety.  Positive for loss of appetite.  She has pelvic pain that can be as severe as 10 out of 10 this is been present  for 3 weeks.  Since her last visit she underwent surgery on 03/11/18 as follows: Robotic-assisted TLH/BSO / Sentinel lymph node injection and biopsy/ Pelvic washings/  Pelvic lymphadenectomy/ Paraaortic lymphadenectomy There were no perioperative complications.   Final pathology revealed with pertinent highlights bolded: 1. 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 - CARCINOMA INVADES FOR A DEPTH OF 1.3 CM WHERE MYOMETRIAL THICKNESS IS 1.8 CM (GREATER THAN 50%). - UTERINE SEROSA NOT INVOLVED. - NEGATIVE FOR LYMPHOVASCULAR INVASION. - BENIGN BILATERAL FALLOPIAN TUBES AND OVARIES. - SEE ONCOLOGY TABLE. - SEE NOTE 2. Lymph node, sentinel, biopsy, left external iliac - LYMPH NODE WITH ISOLATED TUMOR CELLS (ITC) - IMMUNOHISTOCHEMICAL STAIN FOR PANKERATIN IS POSITIVE FOR ISOLATED TUMOR CELLS 3. Lymph node, sentinel, biopsy, left obturator - LYMPH NODE, NEGATIVE FOR CARCINOMA (0/1). - IMMUNOHISTOCHEMICAL STAIN FOR PANKERATIN IS NEGATIVE 4. Lymph node, sentinel, biopsy, left proximal external iliac - LYMPH NODE, NEGATIVE FOR CARCINOMA (0/1). - IMMUNOHISTOCHEMICAL STAIN FOR PANKERATIN IS NEGATIVE 5. Lymph node, sentinel, biopsy, right obturator - LYMPH NODE, NEGATIVE FOR CARCINOMA (0/1). - IMMUNOHISTOCHEMICAL STAIN FOR PANKERATIN IS NEGATIVE 6. Lymph node, sentinel, biopsy, right proximal external iliac - LYMPH NODE, NEGATIVE FOR CARCINOMA (0/1). - IMMUNOHISTOCHEMICAL STAIN FOR PANKERATIN IS NEGATIVE 7. Lymph nodes, regional resection, left pelvic - FIVE LYMPH NODES, NEGATIVE FOR CARCINOMA (0/5). 8. Lymph nodes, regional resection, right pelvic - THREE LYMPH NODES, NEGATIVE FOR CARCINOMA (0/3). 9. Lymph node, sentinel,  biopsy, presacral - LYMPH NODE, NEGATIVE FOR CARCINOMA (0/1). IMMUNOHISTOCHEMICAL STAIN FOR PANKERATIN IS NEGATIVE 10.  Lymph node, biopsy, right peri-aortic - LYMPH NODE, NEGATIVE FOR CARCINOMA (0/1). 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  Oncologic History:    No history exists.    Measurement of disease:  . Will be clinical exam and imaging PRN  Radiology:   CXR 03/10/18 NED  02/15/18 - CTAP - Lower chest: No acute abnormality. Hepatobiliary: No focal liver abnormality is seen. Diffuse low attenuation of the liver as can be seen with hepatic steatosis. No gallstones, gallbladder wall thickening, or biliary dilatation. Pancreas: Unremarkable. No pancreatic ductal dilatation or surrounding inflammatory changes. Spleen: Normal in size without focal abnormality. Adrenals/Urinary Tract: Normal adrenal glands. Stable right renal cysts. No solid renal mass. No urolithiasis or obstructive uropathy. Normal bladder. Stomach/Bowel: Stomach is within normal limits. No evidence of bowel wall thickening, distention, or inflammatory changes. Vascular/Lymphatic: Normal caliber abdominal aorta with mild atherosclerosis. No lymphadenopathy. Reproductive: No adnexal mass. 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 Other: No fluid collection or hematoma. Musculoskeletal: No acute osseous abnormality. No aggressive osseous lesion. Mild osteoarthritis of bilateral sacroiliac joints, right worse than left  Current Meds:  Outpatient Encounter Medications as of 06/28/2018  Medication Sig  . amLODipine (NORVASC) 5 MG tablet Take 5 mg by mouth daily.  Marland Kitchen atorvastatin (LIPITOR) 20 MG tablet Take 20 mg by mouth daily.  Marland Kitchen dicyclomine (BENTYL) 20 MG tablet Take 1 tablet (20 mg total) by mouth every 6 (six) hours as needed for spasms (abdominal cramping).  Marland Kitchen docusate sodium (COLACE) 100 MG  capsule Take 1 capsule (100 mg total) by mouth 2 (two) times daily.  . hydrochlorothiazide (HYDRODIURIL) 25 MG tablet Take 25 mg by mouth daily.  . hydroxypropyl methylcellulose / hypromellose (ISOPTO TEARS / GONIOVISC) 2.5 % ophthalmic solution Place 1 drop into both eyes at bedtime.  . metFORMIN (GLUCOPHAGE) 500 MG tablet Take 500 mg by mouth 2 (two) times daily with a meal.   . ONETOUCH DELICA LANCETS 62Z MISC   . ONETOUCH VERIO test strip   . Riboflavin (VITAMIN B-2 PO) Take 125 mg by mouth daily.  . sitaGLIPtin (JANUVIA) 50 MG tablet Take 50 mg by mouth daily.  . Vitamin D, Ergocalciferol, (DRISDOL) 50000 UNITS CAPS capsule Take 50,000 Units by mouth every 7 (seven) days. Takes on Thursdays.   No facility-administered encounter medications on file as of 06/28/2018.     Allergy:  Allergies  Allergen Reactions  . Aspirin Itching and Rash    swollen   . Penicillins Other (See Comments)    RECEIVED cephalosporin in OR 03/11/18 without incident PCN no problem as a child, but then as adult "my mouth went sideway"  I reviewed her PCN allergy. I do not believe this is a true allergy to penicillin. States she had it when she was younger without problem. We gave her cephalosporin based antibiotic in the OR 03/11/18  Social Hx:  Tobacco use: None Alcohol use: None Illicit Drug use: None Illicit IV Drug use: None  Past Surgical Hx:  Past Surgical History:  Procedure Laterality Date  . ABDOMINAL HYSTERECTOMY     total hysterectomy Dr. Gerarda Fraction 03-11-18  . APPENDECTOMY     Open HOWEVER incision is in RUQ  . COLONOSCOPY  N/A 12/14/2013   Procedure: COLONOSCOPY;  Surgeon: Rogene Houston, MD;  Location: [facility];  Service: Endoscopy;  Laterality: N/A;  100  . ESOPHAGOGASTRODUODENOSCOPY N/A 12/26/2015   Procedure: ESOPHAGOGASTRODUODENOSCOPY (EGD);  Surgeon: Rogene Houston, MD;  Location: [facility];  Service: Endoscopy;  Laterality: N/A;  2:45  . ROBOTIC ASSISTED TOTAL HYSTERECTOMY WITH  BILATERAL SALPINGO OOPHERECTOMY N/A 03/11/2018   Procedure: XI ROBOTIC ASSISTED TOTAL HYSTERECTOMY WITH BILATERAL SALPINGO OOPHORECTOMY, WITH STAGING;  Surgeon: Isabel Caprice, MD;  Location: [facility];  Service: Gynecology;  Laterality: N/A;  . SENTINEL NODE BIOPSY N/A 03/11/2018   Procedure: SENTINEL NODE BIOPSY;  Surgeon: Isabel Caprice, MD;  Location: [facility];  Service: Gynecology;  Laterality: N/A;  . TUBAL LIGATION      Past Medical Hx:  Past Medical History:  Diagnosis Date  . Arthritis   . Diabetes ([place])     type 2  . Endometrial cancer ([place]) 02/24/2018  . Hypertension    x 5 yr  . Pneumonia     Past Gynecological History:   GYNECOLOGIC HISTORY:  No LMP recorded. Patient is postmenopausal. Menarche: 77 years old P 6016 (only has 2 living children) LMP 50 Contraceptive< 10 years oral contraceptive HRT none  Last Pap states 2017 by PCP and was called and told it was normal  Family Hx:  Family History  Problem Relation Age of Onset  . Diabetes Mother   . Diabetes Brother   . Colon cancer Neg Hx     Review of Systems:  Review of Systems  Gastrointestinal: Positive for diarrhea.  All other systems reviewed and are negative.   Vitals:  There were no vitals taken for this visit. There is no height or weight on file to calculate BMI.   Physical Exam:  ECOG PERFORMANCE STATUS: 0 - Asymptomatic  General :  Well developed, 77 y.o., female in no apparent distress HEENT:  Normocephalic/atraumatic, symmetric, EOMI, eyelids normal Neck:   No visible masses.  Respiratory:  Respirations unlabored, no use of accessory muscles CV:   Deferred Breast:  Deferred Musculoskeletal: Normal muscle strength. Abdomen:  Trocar sites are healed. No visible masses or protrusion Extremities:  No visible edema or deformities Skin:   Normal inspection Neuro/Psych:  No focal motor deficit, no abnormal mental status. Normal gait. Normal affect. Alert and oriented to person, place, and  time  Genito Urinary: Vulva: Normal external female genitalia.  Bladder/urethra: Urethral meatus normal in size and location. No lesions or   masses, well supported bladder Speculum exam: Vagina: No lesion, no discharge, no bleeding. Vaginal cuff intact. Shortened vagina Bimanual exam: Cervix/Uterus/Adnexa: Surgically absent  Adnexa: No masses. Rectovaginal:  Good tone, no masses, no cul de sac nodularity, no parametrial involvement or nodularity.    Stage 2 G3 EM Uterine CA with +ITC on sentinel lymph node x 1. Assessment/Plan: 1. Counseling for uterine cancer ? She has ITC in one of the lymph nodes.  ? We discussed previously this may increase our concern for recurrence, but we have no data to alter treatment recommendations because of this.  ? She had full nodal dissection in addition to the sentinel nodes. ? Therefore I will not recommend chemotherapy ? Given the cervical stromal involvement I recommended and she has completed radiation with EBRT to the pelvis and brachytherapy to the vagina ? She was presented at Multidisciplinary Tumor Board which agreed with above 2. Surveillance plan ? RTC Q3 months for pelvic ? No pap smears in  surveillance ? Plan imaging CT CAP Q6 mo x 2 years; will plan first scan prior to her next visit in Jan 2020 3. Vaginal dilator - too early to prescribe. Will defer to Dr. Sondra Come  Face to face time with patient was 15 minutes. Over 50% of this time was spent on counseling and coordination of care.   Isabel Caprice, MD  06/24/2018, 12:55 PM  Cc: Nat Math, MD (Referring OB/GYN) Russella Dar, MD (PCP) Gery Pray, MD (Radiation Oncology)

## 2018-06-28 ENCOUNTER — Inpatient Hospital Stay: Payer: Medicare HMO | Attending: Obstetrics | Admitting: Obstetrics

## 2018-06-28 ENCOUNTER — Telehealth: Payer: Self-pay | Admitting: *Deleted

## 2018-06-28 ENCOUNTER — Encounter: Payer: Self-pay | Admitting: Obstetrics

## 2018-06-28 VITALS — BP 129/53 | HR 75 | Temp 97.6°F | Resp 20 | Ht 61.0 in | Wt 110.2 lb

## 2018-06-28 DIAGNOSIS — C541 Malignant neoplasm of endometrium: Secondary | ICD-10-CM

## 2018-06-28 DIAGNOSIS — Z923 Personal history of irradiation: Secondary | ICD-10-CM | POA: Diagnosis not present

## 2018-06-28 DIAGNOSIS — Z90722 Acquired absence of ovaries, bilateral: Secondary | ICD-10-CM

## 2018-06-28 DIAGNOSIS — Z90710 Acquired absence of both cervix and uterus: Secondary | ICD-10-CM | POA: Diagnosis not present

## 2018-06-28 NOTE — Addendum Note (Signed)
Addended by: Baruch Merl on: 06/28/2018 10:35 AM   Modules accepted: Orders

## 2018-06-28 NOTE — Patient Instructions (Signed)
1. Return to Dr. Gerarda Fraction in 3 months for a pelvic. 2. Vaginal dilator to be given by Dr. Sondra Come after radiation. 3. We will order a scan prior to your return visit.

## 2018-06-28 NOTE — Telephone Encounter (Signed)
CALLED PATIENT TO REMIND OF HDR TX. FOR 06-29-18 @ 9 AM, SPOKE WITH PATIENT AND SHE IS AWARE OF THIS [place].

## 2018-06-29 ENCOUNTER — Ambulatory Visit
Admission: RE | Admit: 2018-06-29 | Discharge: 2018-06-29 | Disposition: A | Discharge status: Home / Self Care | Payer: Medicare HMO | Source: Ambulatory Visit | Attending: Radiation Oncology | Admitting: Radiation Oncology

## 2018-06-29 DIAGNOSIS — C541 Malignant neoplasm of endometrium: Secondary | ICD-10-CM | POA: Diagnosis not present

## 2018-06-29 NOTE — Progress Notes (Signed)
  Radiation Oncology         (336) [phone] ________________________________  Name: Kristen Mccoy MRN: [id number]  Date: 06/29/2018  DOB: 11/21/1940  CC: Monico Blitz, MD  Isabel Caprice, MD  HDR BRACHYTHERAPY NOTE  DIAGNOSIS:  Stage II, grade 3 endometrial cancer with isolated tumor cells on sentinel node [pT2,pN0(i+)]   Simple treatment device note: Patient had construction of her custom vaginal cylinder. She will be treated with a 2.5 cm diameter segmented cylinder. This conforms to her anatomy without undue discomfort.  Vaginal brachytherapy procedure node: The patient was brought to the [place] suite. Identity was confirmed. All relevant records and images related to the planned course of therapy were reviewed. The patient freely provided informed written consent to proceed with treatment after reviewing the details related to the planned course of therapy. The consent form was witnessed and verified by the simulation staff. Then, the patient was set-up in a stable reproducible supine position for radiation therapy. Pelvic exam revealed the vaginal cuff to be intact. The patient's custom vaginal cylinder was placed in the proximal vagina. This was affixed to the CT/MR stabilization plate to prevent slippage. Patient tolerated the placement well.  Verification simulation note:  A fiducial marker was placed within the vaginal cylinder. An AP and lateral film was then obtained through the pelvis area. This documented accurate position of the vaginal cylinder for treatment.  HDR BRACHYTHERAPY TREATMENT  The remote afterloading device was affixed to the vaginal cylinder by catheter. Patient then proceeded to undergo her third high-dose-rate treatment directed at the proximal vagina. The patient was prescribed a dose of 6.0 gray to be delivered to the mucosal surface. Treatment length was 3.0 cm. Patient was treated with 1 channel using 7 dwell positions. Treatment time was 322.9 seconds. Iridium  192 was the high-dose-rate source for treatment. The patient tolerated the treatment well. After completion of her therapy, a radiation survey was performed documenting return of the iridium source into the [employer] safe.   PLAN: The patient has completed her postoperative external beam and intracavitary brachytherapy treatments. She will be scheduled for routine one month follow-up. ________________________________  Blair Promise, PhD, MD

## 2018-06-30 ENCOUNTER — Encounter: Payer: Self-pay | Admitting: Radiation Oncology

## 2018-06-30 NOTE — Progress Notes (Signed)
  Radiation Oncology         (336) [phone] ________________________________  Name: Kristen Mccoy MRN: [id number]  Date: 06/30/2018  DOB: 1941-07-17  End of Treatment Note  Diagnosis:   Stage II, grade 3 endometrial cancer with isolated tumor cells on sentinel node [pT2,pN0(i+)]     Indication for treatment:  Curative       Radiation treatment dates:   05/06/2018-06/10/2018;  HDR: 06/15/2018, 06/22/2018, and 06/29/2018  Site/dose:  1. Pelvis, 1.8 Gy in 25 fractions for a total dose of 45 Gy          2. Vaginal cuff, 6 Gy in 3 fractions for a total dose of 18 Gy  Beams/energy:   HDR Ir-Vaginal, Iridium HDR, patient was treated with a 2.5 cm diameter segmented cylinder, Rx length of 3 cm, prescription was 6.0 gray to the mucosal surface  Narrative: The patient tolerated radiation treatment relatively well. At the beginning of her treatments, she reports associated diarrhea (resolved). She denied skin changes, urinary bladder changes, pain, fatigue. At the end of her treatment, she noted dysuria, mild fatigue. She denied hematuria, vaginal discharge/bleeding, rectal bleeding, constipation, diarrhea, nausea, and vomiting.    Plan: The patient has completed radiation treatment. The patient will return to radiation oncology clinic for routine followup in one month. I advised them to call or return sooner if they have any questions or concerns related to their recovery or treatment.  -----------------------------------  Blair Promise, PhD, MD  This document serves as a record of services personally performed by Gery Pray, MD. It was created on his behalf by [facility], a trained medical scribe. The creation of this record is based on the scribe's personal observations and the provider's statements to them. This document has been checked and approved by the attending provider.

## 2018-07-06 ENCOUNTER — Ambulatory Visit: Payer: Medicare HMO | Admitting: Radiation Oncology

## 2018-07-13 ENCOUNTER — Ambulatory Visit: Payer: Medicare HMO | Admitting: Radiation Oncology

## 2018-07-14 ENCOUNTER — Telehealth: Payer: Self-pay | Admitting: *Deleted

## 2018-07-14 NOTE — Telephone Encounter (Signed)
Called patient to ask about cancelling one appt.due to the fact that she has 2, lvm for a return call

## 2018-07-15 ENCOUNTER — Ambulatory Visit
Admission: RE | Admit: 2018-07-15 | Discharge: 2018-07-15 | Disposition: A | Discharge status: Home / Self Care | Payer: Medicare HMO | Source: Ambulatory Visit | Attending: Radiation Oncology | Admitting: Radiation Oncology

## 2018-07-15 ENCOUNTER — Encounter: Payer: Self-pay | Admitting: Radiation Oncology

## 2018-07-15 ENCOUNTER — Other Ambulatory Visit: Payer: Self-pay

## 2018-07-15 VITALS — BP 139/56 | HR 79 | Temp 97.7°F | Resp 20 | Ht 61.0 in | Wt 115.4 lb

## 2018-07-15 DIAGNOSIS — Z7984 Long term (current) use of oral hypoglycemic drugs: Secondary | ICD-10-CM | POA: Diagnosis not present

## 2018-07-15 DIAGNOSIS — C541 Malignant neoplasm of endometrium: Secondary | ICD-10-CM | POA: Diagnosis present

## 2018-07-15 DIAGNOSIS — Z923 Personal history of irradiation: Secondary | ICD-10-CM | POA: Diagnosis not present

## 2018-07-15 DIAGNOSIS — Z886 Allergy status to analgesic agent status: Secondary | ICD-10-CM | POA: Insufficient documentation

## 2018-07-15 DIAGNOSIS — Z79899 Other long term (current) drug therapy: Secondary | ICD-10-CM | POA: Diagnosis not present

## 2018-07-15 DIAGNOSIS — Z880 Allergy status to penicillin: Secondary | ICD-10-CM | POA: Insufficient documentation

## 2018-07-15 NOTE — Progress Notes (Signed)
Radiation Oncology         [phone]) [phone] ________________________________  Name: Kristen Mccoy MRN: [id number]  Date: 07/15/2018  DOB: 07/10/1941  Follow-Up Visit Note  CC: Kristen Blitz, MD  Kristen Caprice, MD    ICD-10-CM   1. Endometrial cancer (Kristen Mccoy) C54.1     Diagnosis:   77 y.o. female with Stage II, grade 3 endometrial cancer with isolated tumor cells on sentinel node [pT2,pN0(i+)]    Interval Since Last Radiation:  2 weeks   Radiation treatment dates:   05/06/2018-06/10/2018;                                                   HDR: 06/15/2018, 06/22/2018, and 06/29/2018  Site/dose:  1. Pelvis / 45 Gy in 25 fractions                      2. Vagina / 30 Gy in 5 fractions  Narrative:  The patient returns today for routine follow-up.  She denies any pain or fatigue. She reports mild diarrhea that is relieved by Imodium taken once daily. She denies having nausea or vomiting. She denies rectal or vaginal bleeding. She denies vaginal discharge. She reports occasional mild dysuria. She denies having hematuria, urgency, or frequency. She denies any skin irritation.         ALLERGIES:  is allergic to aspirin and penicillins.  Meds: Current Outpatient Medications  Medication Sig Dispense Refill  . amLODipine (NORVASC) 5 MG tablet Take 5 mg by mouth daily.    Marland Kitchen atorvastatin (LIPITOR) 20 MG tablet Take 20 mg by mouth daily.    Marland Kitchen dicyclomine (BENTYL) 20 MG tablet Take 1 tablet (20 mg total) by mouth every 6 (six) hours as needed for spasms (abdominal cramping). 15 tablet 0  . docusate sodium (COLACE) 100 MG capsule Take 1 capsule (100 mg total) by mouth 2 (two) times daily. 30 capsule 0  . hydrochlorothiazide (HYDRODIURIL) 25 MG tablet Take 25 mg by mouth daily.    . hydroxypropyl methylcellulose / hypromellose (ISOPTO TEARS / GONIOVISC) 2.5 % ophthalmic solution Place 1 drop into both eyes at bedtime.    . metFORMIN (GLUCOPHAGE) 500 MG tablet Take 500 mg by mouth 2 (two) times daily  with a meal.     . ONETOUCH DELICA LANCETS 55D MISC     . ONETOUCH VERIO test strip     . Riboflavin (VITAMIN B-2 PO) Take 125 mg by mouth daily.    . sitaGLIPtin (JANUVIA) 50 MG tablet Take 50 mg by mouth daily.    . Vitamin D, Ergocalciferol, (DRISDOL) 50000 UNITS CAPS capsule Take 50,000 Units by mouth every 7 (seven) days. Takes on Thursdays.     No current facility-administered medications for this encounter.     Physical Findings: The patient is in no acute distress. Patient is alert and oriented.  height is 5\' 1"  (1.549 m) and weight is 115 lb 6.4 oz (52.3 kg). Her oral temperature is 97.7 F (36.5 C). Her blood pressure is 139/56 (abnormal) and her pulse is 79. Her respiration is 20 and oxygen saturation is 100%.   Lungs are clear to auscultation bilaterally. Heart has regular rate and rhythm. No palpable cervical, supraclavicular, or axillary adenopathy. Abdomen soft, non-tender, normal bowel sounds. pelvic Exam deferred in light of recent completion of treatment.  Lab Findings: Lab Results  Component Value Date   WBC 10.0 03/12/2018   HGB 10.4 (L) 03/12/2018   HCT 33.0 (L) 03/12/2018   MCV 79.1 03/12/2018   PLT 244 03/12/2018    Radiographic Findings: No results found.  Impression:  The patient is recovering from the effects of radiation.    Plan:  Patient will follow up with Dr. Gerarda Mccoy in January 2020 and will return to radiation oncology in April 2020 (3 months after follow-up with Dr. Gerarda Mccoy). Patient was given extra small and small vaginal dilators with instructions on its use. ____________________________________  Kristen Promise, PhD, MD  This document serves as a record of services personally performed by Kristen Pray, MD. It was created on his behalf by Kristen Mccoy, a trained medical scribe. The creation of this record is based on the scribe's personal observations and the provider's statements to them. This document has been checked and approved by the  attending provider.

## 2018-07-15 NOTE — Progress Notes (Signed)
Pt here today for a 1 month follow-up for endometrial cancer. Pt denies having any pain or fatigue. Pt denies having any nausea or vomiting. Pt states that she has mild diarrhea and uses Imodium. Pt denies having any rectal or vaginal bleeding. Pt denies having any vaginal discharge. Pt states that she has occasional mild dysuria. Pt denies having hematuria, urgency or frequency. Pt denies having any skin irritation.  Dilator teaching done. Extra Small and small dilators given. Pt verbalized understanding.   BP (!) 139/56 (BP Location: Left Arm, Patient Position: Sitting)   Pulse 79   Temp 97.7 F (36.5 C) (Oral)   Resp 20   Ht 5\' 1"  (1.549 m)   Wt 115 lb 6.4 oz (52.3 kg)   SpO2 100%   BMI 21.80 kg/m    Wt Readings from Last 3 Encounters:  07/15/18 115 lb 6.4 oz (52.3 kg)  06/28/18 110 lb 3.2 oz (50 kg)  06/15/18 109 lb 6.4 oz (49.6 kg)

## 2018-07-29 ENCOUNTER — Ambulatory Visit: Payer: Medicare HMO | Admitting: Radiation Oncology

## 2018-09-27 ENCOUNTER — Inpatient Hospital Stay: Payer: Medicare Other | Attending: Obstetrics

## 2018-09-27 DIAGNOSIS — R300 Dysuria: Secondary | ICD-10-CM | POA: Insufficient documentation

## 2018-09-27 DIAGNOSIS — C541 Malignant neoplasm of endometrium: Secondary | ICD-10-CM | POA: Diagnosis not present

## 2018-09-27 DIAGNOSIS — Z90722 Acquired absence of ovaries, bilateral: Secondary | ICD-10-CM | POA: Insufficient documentation

## 2018-09-27 DIAGNOSIS — C779 Secondary and unspecified malignant neoplasm of lymph node, unspecified: Secondary | ICD-10-CM | POA: Insufficient documentation

## 2018-09-27 DIAGNOSIS — Z90710 Acquired absence of both cervix and uterus: Secondary | ICD-10-CM | POA: Diagnosis not present

## 2018-09-27 LAB — BASIC METABOLIC PANEL - CANCER CENTER ONLY
Anion gap: 8 (ref 5–15)
BUN: 12 mg/dL (ref 8–23)
CHLORIDE: 107 mmol/L (ref 98–111)
CO2: 28 mmol/L (ref 22–32)
CREATININE: 0.86 mg/dL (ref 0.44–1.00)
Calcium: 9.1 mg/dL (ref 8.9–10.3)
GFR, Est AFR Am: >60 mL/min (ref >60)
GFR, Estimated: >60 mL/min (ref >60)
GLUCOSE: 87 mg/dL (ref 70–99)
Potassium: 3.8 mmol/L (ref 3.5–5.1)
SODIUM: 143 mmol/L (ref 135–145)

## 2018-09-29 ENCOUNTER — Ambulatory Visit (HOSPITAL_COMMUNITY)
Admission: RE | Admit: 2018-09-29 | Discharge: 2018-09-29 | Disposition: A | Discharge status: Home / Self Care | Payer: Medicare Other | Source: Ambulatory Visit | Attending: Obstetrics | Admitting: Obstetrics

## 2018-09-29 ENCOUNTER — Encounter (HOSPITAL_COMMUNITY): Payer: Self-pay

## 2018-09-29 DIAGNOSIS — C541 Malignant neoplasm of endometrium: Secondary | ICD-10-CM | POA: Diagnosis present

## 2018-09-29 MED ORDER — SODIUM CHLORIDE (PF) 0.9 % IJ SOLN
INTRAMUSCULAR | Status: AC
  Filled 2018-09-29: qty 50

## 2018-09-29 MED ORDER — IOHEXOL 300 MG/ML  SOLN
100.0000 mL | Freq: Once | INTRAMUSCULAR | Status: AC | PRN
  Administered 2018-09-29: 100 mL via INTRAVENOUS

## 2018-10-03 NOTE — Progress Notes (Signed)
[place] at [facility]   Progress Note : Established Patient Follow-Up  Consult was originally requested by Dr. Nat Math for uterine cancer  CC:  Chief Complaint  Patient presents with  . Endometrial cancer [facility])   Gyn Oncologic Summary 1. 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  03/11/18 RA-TLH/BSO/SLNBx/PLND/PALND, Luther Redo  04/2018 - 06/29/18 EBRT 45 Gy and HDR vag brachy x 5  2. MMR normal/MSI stable   HPI: Kristen Mccoy  is a very nice 78 y.o. P6 (with 2 living children)  . Interval History  Since her last visit followup CTScan - 09/2018 - NED but there is a mediastinal node with recommendations to follow in 6 months. She completed her radiation the day after seeing me last. She denies bleeding, denies pain. She has plans to see Dr. Sondra Come in April 2020 (ie 3 months from today)  . Presenting History (Oncologic Course if applicable) Patient went to the emergency room complaining of pelvic pain states she had CT imaging and was told everything was normal.  She then followed up at [facility] complaining of abdominal pain, nausea, and painful BM. 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  She then followed up with Dr. Adah Perl.  Both a Pap smear and endometrial biopsy were obtained.  Both show adenocarcinoma.  The endometrial biopsy 02/24/2018 states the tissue is poorly differentiated.  We did have the cytology addended looking for HPV and that is noted to be HPV high risk not detected.  The patient is therefore referred for management.  Surprisingly she denied postmenopausal bleeding until her endometrial biopsy was performed.  She denies nausea and vomiting denies changes in bowel or bladder function.  She does note a 6 pound weight loss over the past 6 months.  Positive for early satiety.   Positive for loss of appetite.  She has pelvic pain that can be as severe as 10 out of 10 this is been present for 3 weeks.  Since her last visit she underwent surgery on 03/11/18 as follows: Robotic-assisted TLH/BSO / Sentinel lymph node injection and biopsy/ Pelvic washings/  Pelvic lymphadenectomy/ Paraaortic lymphadenectomy There were no perioperative complications.   Final pathology revealed with pertinent highlights bolded: 1. 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 - CARCINOMA INVADES FOR A DEPTH OF 1.3 CM WHERE MYOMETRIAL THICKNESS IS 1.8 CM (GREATER THAN 50%). - UTERINE SEROSA NOT INVOLVED. - NEGATIVE FOR LYMPHOVASCULAR INVASION. - BENIGN BILATERAL FALLOPIAN TUBES AND OVARIES. - SEE ONCOLOGY TABLE. - SEE NOTE 2. Lymph node, sentinel, biopsy, left external iliac - LYMPH NODE WITH ISOLATED TUMOR CELLS (ITC) - IMMUNOHISTOCHEMICAL STAIN FOR PANKERATIN IS POSITIVE FOR ISOLATED TUMOR CELLS 3. Lymph node, sentinel, biopsy, left obturator - LYMPH NODE, NEGATIVE FOR CARCINOMA (0/1). - IMMUNOHISTOCHEMICAL STAIN FOR PANKERATIN IS NEGATIVE 4. Lymph node, sentinel, biopsy, left proximal external iliac - LYMPH NODE, NEGATIVE FOR CARCINOMA (0/1). - IMMUNOHISTOCHEMICAL STAIN FOR PANKERATIN IS NEGATIVE 5. Lymph node, sentinel, biopsy, right obturator - LYMPH NODE, NEGATIVE FOR CARCINOMA (0/1). - IMMUNOHISTOCHEMICAL STAIN FOR PANKERATIN IS NEGATIVE 6. Lymph node, sentinel, biopsy, right proximal external iliac - LYMPH NODE, NEGATIVE FOR CARCINOMA (0/1). - IMMUNOHISTOCHEMICAL STAIN FOR PANKERATIN IS NEGATIVE 7. Lymph nodes, regional resection, left pelvic - FIVE LYMPH  NODES, NEGATIVE FOR CARCINOMA (0/5). 8. Lymph nodes, regional resection, right pelvic - THREE LYMPH NODES, NEGATIVE FOR CARCINOMA (0/3). 9. Lymph node, sentinel, biopsy,  presacral - LYMPH NODE, NEGATIVE FOR CARCINOMA (0/1). IMMUNOHISTOCHEMICAL STAIN FOR PANKERATIN IS NEGATIVE 10. Lymph node, biopsy, right peri-aortic - LYMPH NODE, NEGATIVE FOR CARCINOMA (0/1). 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  Oncologic History:    No history exists.    Measurement of disease:  . Will be clinical exam and imaging PRN  Radiology:   CXR 03/10/18 NED  02/15/18 - CTAP - Lower chest: No acute abnormality. Hepatobiliary: No focal liver abnormality is seen. Diffuse low attenuation of the liver as can be seen with hepatic steatosis. No gallstones, gallbladder wall thickening, or biliary dilatation. Pancreas: Unremarkable. No pancreatic ductal dilatation or surrounding inflammatory changes. Spleen: Normal in size without focal abnormality. Adrenals/Urinary Tract: Normal adrenal glands. Stable right renal cysts. No solid renal mass. No urolithiasis or obstructive uropathy. Normal bladder. Stomach/Bowel: Stomach is within normal limits. No evidence of bowel wall thickening, distention, or inflammatory changes. Vascular/Lymphatic: Normal caliber abdominal aorta with mild atherosclerosis. No lymphadenopathy. Reproductive: No adnexal mass. 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 Other: No fluid collection or hematoma. Musculoskeletal: No acute osseous abnormality. No aggressive osseous lesion. Mild osteoarthritis of bilateral sacroiliac joints, right worse than left Ct Chest W Contrast  Result Date: 09/29/2018 CLINICAL DATA:  Follow-up endometrial carcinoma. Previous surgery and chemotherapy. EXAM: CT CHEST, ABDOMEN, AND PELVIS WITH CONTRAST TECHNIQUE: Multidetector CT imaging of the chest, abdomen and pelvis was performed following the  standard protocol during bolus administration of intravenous contrast. CONTRAST:  124m OMNIPAQUE IOHEXOL 300 MG/ML  SOLN COMPARISON:  AP CT on 02/15/2018 FINDINGS: CT CHEST FINDINGS Cardiovascular: No acute findings. Aortic and coronary artery atherosclerosis. 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 Mediastinum/Lymph Nodes: 11 mm mediastinal lymph node in the AP window. No other enlarged lymph nodes identified. Lungs/Pleura: No pulmonary infiltrate or mass identified. Mild scarring and bronchiectasis seen in both lower lobes. No effusion present. Musculoskeletal:  No suspicious bone lesions identified. CT ABDOMEN AND PELVIS FINDINGS Hepatobiliary: No masses identified. Gallbladder is unremarkable. Pancreas:  No mass or inflammatory changes. Spleen:  Within normal limits in size and appearance. Adrenals/Urinary tract: No masses or hydronephrosis. Several small right renal cysts again noted. Stomach/Bowel: No evidence of obstruction, inflammatory process, or abnormal fluid collections. Vascular/Lymphatic: No pathologically enlarged lymph nodes identified. No abdominal aortic aneurysm. Aortic atherosclerosis. Reproductive: Prior hysterectomy noted. Adnexal regions are unremarkable in appearance. Other:  No evidence of peritoneal thickening or ascites. Musculoskeletal:  No suspicious bone lesions identified. IMPRESSION: No evidence of recurrent or metastatic endometrial carcinoma. 11 mm mediastinal lymph node in aortopulmonary window, which is nonspecific and likely reactive. Recommend continued follow-up by chest CT in 6 months. Electronically Signed   By: JEarle GellM.D.   On: 09/29/2018 14:10   Ct Abdomen Pelvis W Contrast  Result Date: 09/29/2018 CLINICAL DATA:  Follow-up endometrial carcinoma. Previous surgery and chemotherapy. EXAM: CT CHEST, ABDOMEN, AND PELVIS WITH CONTRAST TECHNIQUE: Multidetector CT imaging of the chest, abdomen and pelvis was performed following the standard  protocol during bolus administration of intravenous contrast. CONTRAST:  109mOMNIPAQUE IOHEXOL 300 MG/ML  SOLN COMPARISON:  AP CT on 02/15/2018 FINDINGS: CT CHEST FINDINGS Cardiovascular:  No acute findings. Aortic and coronary artery atherosclerosis. 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 Mediastinum/Lymph Nodes: 11 mm mediastinal lymph node in the AP window. No other enlarged lymph nodes identified. Lungs/Pleura: No pulmonary infiltrate or mass identified. Mild scarring and bronchiectasis seen in both lower lobes. No effusion present. Musculoskeletal:  No suspicious bone lesions identified. CT ABDOMEN AND PELVIS FINDINGS Hepatobiliary: No masses identified. Gallbladder is unremarkable. Pancreas:  No mass or inflammatory changes. Spleen:  Within normal limits in size and appearance. Adrenals/Urinary tract: No masses or hydronephrosis. Several small right renal cysts again noted. Stomach/Bowel: No evidence of obstruction, inflammatory process, or abnormal fluid collections. Vascular/Lymphatic: No pathologically enlarged lymph nodes identified. No abdominal aortic aneurysm. Aortic atherosclerosis. Reproductive: Prior hysterectomy noted. Adnexal regions are unremarkable in appearance. Other:  No evidence of peritoneal thickening or ascites. Musculoskeletal:  No suspicious bone lesions identified. IMPRESSION: No evidence of recurrent or metastatic endometrial carcinoma. 11 mm mediastinal lymph node in aortopulmonary window, which is nonspecific and likely reactive. Recommend continued follow-up by chest CT in 6 months. Electronically Signed   By: Earle Gell M.D.   On: 09/29/2018 14:10       Current Meds:  Outpatient Encounter Medications as of 10/04/2018  Medication Sig  . amLODipine (NORVASC) 5 MG tablet Take 5 mg by mouth daily.  Marland Kitchen atorvastatin (LIPITOR) 20 MG tablet Take 20 mg by mouth daily.  Marland Kitchen dicyclomine (BENTYL) 20 MG tablet Take 1 tablet (20 mg total) by mouth every 6 (six)  hours as needed for spasms (abdominal cramping).  Marland Kitchen docusate sodium (COLACE) 100 MG capsule Take 1 capsule (100 mg total) by mouth 2 (two) times daily.  . hydrochlorothiazide (HYDRODIURIL) 25 MG tablet Take 25 mg by mouth daily.  . hydroxypropyl methylcellulose / hypromellose (ISOPTO TEARS / GONIOVISC) 2.5 % ophthalmic solution Place 1 drop into both eyes at bedtime.  . metFORMIN (GLUCOPHAGE) 500 MG tablet Take 500 mg by mouth 2 (two) times daily with a meal.   . ONETOUCH DELICA LANCETS 60V MISC   . ONETOUCH VERIO test strip   . Riboflavin (VITAMIN B-2 PO) Take 125 mg by mouth daily.  . sitaGLIPtin (JANUVIA) 50 MG tablet Take 50 mg by mouth daily.  . Vitamin D, Ergocalciferol, (DRISDOL) 50000 UNITS CAPS capsule Take 50,000 Units by mouth every 7 (seven) days. Takes on Thursdays.   No facility-administered encounter medications on file as of 10/04/2018.     Allergy:  Allergies  Allergen Reactions  . Aspirin Itching and Rash    swollen   . Penicillins Other (See Comments)    RECEIVED cephalosporin in OR 03/11/18 without incident PCN no problem as a child, but then as adult "my mouth went sideway"  I reviewed her PCN allergy. I do not believe this is a true allergy to penicillin. States she had it when she was younger without problem. We gave her cephalosporin based antibiotic in the OR 03/11/18  Social Hx:  Tobacco use: None Alcohol use: None Illicit Drug use: None Illicit IV Drug use: None  Past Surgical Hx:  Past Surgical History:  Procedure Laterality Date  . ABDOMINAL HYSTERECTOMY     total hysterectomy Dr. Gerarda Fraction 03-11-18  . APPENDECTOMY     Open HOWEVER incision is in RUQ  . COLONOSCOPY N/A 12/14/2013   Procedure: COLONOSCOPY;  Surgeon: Rogene Houston, MD;  Location[facility];  Service: Endoscopy;  Laterality: N/A;  100  . ESOPHAGOGASTRODUODENOSCOPY N/A 12/26/2015   Procedure: ESOPHAGOGASTRODUODENOSCOPY (EGD);  Surgeon: Bernadene Person  Gloriann Loan, MD;  Location: [facility];   Service: Endoscopy;  Laterality: N/A;  2:45  . ROBOTIC ASSISTED TOTAL HYSTERECTOMY WITH BILATERAL SALPINGO OOPHERECTOMY N/A 03/11/2018   Procedure: XI ROBOTIC ASSISTED TOTAL HYSTERECTOMY WITH BILATERAL SALPINGO OOPHORECTOMY, WITH STAGING;  Surgeon: Isabel Caprice, MD;  Location: [facility];  Service: Gynecology;  Laterality: N/A;  . SENTINEL NODE BIOPSY N/A 03/11/2018   Procedure: SENTINEL NODE BIOPSY;  Surgeon: Isabel Caprice, MD;  Location: [facility] OR[facility];  Service: Gynecology;  Laterality: N/A;  . TUBAL LIGATION      Past Medical Hx:  Past Medical History:  Diagnosis Date  . Arthritis   . Diabetes ([place])     type 2  . Endometrial cancer ([place]) 02/24/2018  . Hypertension    x 5 yr  . Pneumonia     Past Gynecological History:   GYNECOLOGIC HISTORY:  No LMP recorded. Patient has had a hysterectomy. Menarche: 78 years old P 6016 (only has 2 living children) LMP 50 Contraceptive< 10 years oral contraceptive HRT none  Last Pap states 2017 by PCP and was called and told it was normal  Family Hx:  Family History  Problem Relation Age of Onset  . Diabetes Mother   . Diabetes Brother   . Colon cancer Neg Hx     Review of Systems:  Review of Systems  Respiratory: Positive for cough.   Cardiovascular: Positive for leg swelling.  Genitourinary: Positive for frequency.   All other systems reviewed and are negative. Feeling full quickly  Vitals:  Blood pressure (!) 155/62, pulse (!) 104, temperature 99.2 F (37.3 C), temperature source Oral, resp. rate 20, height 5' 1"  (1.549 m), weight 115 lb (52.2 kg), SpO2 100 %. Body mass index is 21.73 kg/m.   Physical Exam:  ECOG PERFORMANCE STATUS: 0 - Asymptomatic  General :  Well developed, 78 y.o., female in no apparent distress HEENT:  Normocephalic/atraumatic, symmetric, EOMI, eyelids normal Neck:   No visible masses.  Respiratory:  Respirations unlabored, no use of accessory muscles CV:   Deferred Breast:  Deferred Musculoskeletal:  Normal muscle strength. Abdomen:  Trocar sites are soft without induration. No visible masses or protrusion Extremities:  No visible edema or deformities Skin:   Normal inspection Neuro/Psych:  No focal motor deficit, no abnormal mental status. Normal gait. Normal affect. Alert and oriented to person, place, and time  Genito Urinary: Vulva: Normal external female genitalia.  Bladder/urethra: Urethral meatus normal in size and location. No lesions or   masses, well supported bladder Speculum exam: Vagina: No lesion, no discharge, no bleeding. Shortened vagina with central fibroisis Bimanual exam: Cervix/Uterus/Adnexa: Surgically absent  Adnexa: No masses. Rectovaginal:  Good tone, no masses, no cul de sac nodularity, no parametrial involvement or nodularity.   Assessment: Stage 2 G3 EM Uterine CA with +ITC on sentinel lymph node x 1.  Plan: 1. Counseling for uterine cancer ? She has ITC in one of the lymph nodes.  ? We discussed previously this may increase our concern for recurrence, but we have no data to alter treatment recommendations because of this.  ? She had full nodal dissection in addition to the sentinel nodes. ? Therefore I will not recommend chemotherapy ? Given the cervical stromal involvement I recommended and she has completed radiation with EBRT to the pelvis and brachytherapy to the vagina ? She was presented at Multidisciplinary Tumor Board which agreed with above 2. Surveillance plan ? Q3 months pelvic (alternating with Dr. Sondra Come)  ? She  will be seeing Dr. Sondra Come in 3 months then back to see me in 6 months ? No pap smears in surveillance ? Plan imaging CT CAP Q6 mo x 2 years; will plan next scan June/July 2020 just before she is back to see me. 3. Vaginal dilator - to continue indefinitely  Face to face time with patient was 15 minutes. Over 50% of this time was spent on counseling and coordination of care.   Isabel Caprice, MD  10/04/2018, 11:34  AM  Cc: Nat Math, MD (Referring OB/GYN) Russella Dar, MD (PCP) Gery Pray, MD (Radiation Oncology)

## 2018-10-04 ENCOUNTER — Inpatient Hospital Stay (HOSPITAL_BASED_OUTPATIENT_CLINIC_OR_DEPARTMENT_OTHER): Payer: Medicare Other | Admitting: Obstetrics

## 2018-10-04 ENCOUNTER — Encounter: Payer: Self-pay | Admitting: Obstetrics

## 2018-10-04 ENCOUNTER — Inpatient Hospital Stay: Payer: Medicare Other

## 2018-10-04 VITALS — BP 155/62 | HR 104 | Temp 99.2°F | Resp 20 | Ht 61.0 in | Wt 115.0 lb

## 2018-10-04 DIAGNOSIS — C779 Secondary and unspecified malignant neoplasm of lymph node, unspecified: Secondary | ICD-10-CM | POA: Diagnosis not present

## 2018-10-04 DIAGNOSIS — Z90722 Acquired absence of ovaries, bilateral: Secondary | ICD-10-CM

## 2018-10-04 DIAGNOSIS — Z90710 Acquired absence of both cervix and uterus: Secondary | ICD-10-CM | POA: Diagnosis not present

## 2018-10-04 DIAGNOSIS — C541 Malignant neoplasm of endometrium: Secondary | ICD-10-CM | POA: Diagnosis not present

## 2018-10-04 DIAGNOSIS — R300 Dysuria: Secondary | ICD-10-CM | POA: Diagnosis not present

## 2018-10-04 LAB — URINALYSIS, COMPLETE (UACMP) WITH MICROSCOPIC
Bacteria, UA: NONE SEEN
Bilirubin Urine: NEGATIVE
GLUCOSE, UA: NEGATIVE mg/dL
HGB URINE DIPSTICK: NEGATIVE
Ketones, ur: NEGATIVE mg/dL
LEUKOCYTES UA: NEGATIVE
NITRITE: NEGATIVE
PH: 5 (ref 5.0–8.0)
Protein, ur: NEGATIVE mg/dL
Specific Gravity, Urine: 1.016 (ref 1.005–1.030)

## 2018-10-04 NOTE — Patient Instructions (Addendum)
1. Return in 6 months with Dr. Gerarda Fraction for another pelvic exam 2. See Dr. Sondra Come in 3 months 3. We will order a scan to be done prior to your return visit with me. 4. Continue the vaginal dilator 5. Please call our office in June 2020 to schedule July 2020 appointment with Dr. Gerarda Fraction.

## 2018-10-05 LAB — URINE CULTURE

## 2018-10-06 ENCOUNTER — Telehealth: Payer: Self-pay | Admitting: *Deleted

## 2018-10-06 NOTE — Telephone Encounter (Signed)
I called patient to inform her of her urine culture results collected on 10/04/2018.  Per [name], NP no infection.  Patient verbalized understanding.

## 2018-10-08 ENCOUNTER — Ambulatory Visit: Payer: Medicare HMO | Admitting: Obstetrics

## 2019-01-20 ENCOUNTER — Other Ambulatory Visit: Payer: Self-pay

## 2019-01-20 ENCOUNTER — Encounter: Payer: Self-pay | Admitting: Radiation Oncology

## 2019-01-20 ENCOUNTER — Ambulatory Visit
Admission: RE | Admit: 2019-01-20 | Discharge: 2019-01-20 | Disposition: A | Discharge status: Home / Self Care | Payer: Medicare Other | Source: Ambulatory Visit | Attending: Radiation Oncology | Admitting: Radiation Oncology

## 2019-01-20 VITALS — BP 154/65 | HR 99 | Temp 97.7°F | Resp 18 | Ht 61.0 in | Wt 115.4 lb

## 2019-01-20 DIAGNOSIS — Z7984 Long term (current) use of oral hypoglycemic drugs: Secondary | ICD-10-CM | POA: Diagnosis not present

## 2019-01-20 DIAGNOSIS — Z79899 Other long term (current) drug therapy: Secondary | ICD-10-CM | POA: Insufficient documentation

## 2019-01-20 DIAGNOSIS — Z8542 Personal history of malignant neoplasm of other parts of uterus: Secondary | ICD-10-CM | POA: Insufficient documentation

## 2019-01-20 DIAGNOSIS — R300 Dysuria: Secondary | ICD-10-CM | POA: Insufficient documentation

## 2019-01-20 DIAGNOSIS — C541 Malignant neoplasm of endometrium: Secondary | ICD-10-CM

## 2019-01-20 NOTE — Progress Notes (Signed)
Pt presents today for f/u with Dr. Sondra Come. Pt denies c/o pain. Pt reports occasional burning with urination, denies hematuria. Pt denies vaginal bleeding/discharge. Pt denies rectal bleeding, diarrhea/constipation.   BP (!) 154/65 (BP Location: Left Arm, Patient Position: Sitting)   Pulse 99   Temp 97.7 F (36.5 C) (Oral)   Resp 18   Ht 5\' 1"  (1.549 m)   Wt 115 lb 6 oz (52.3 kg)   SpO2 98%   BMI 21.80 kg/m   Wt Readings from Last 3 Encounters:  01/20/19 115 lb 6 oz (52.3 kg)  10/04/18 115 lb (52.2 kg)  07/15/18 115 lb 6.4 oz (52.3 kg)   Loma Sousa, RN BSN

## 2019-01-20 NOTE — Progress Notes (Signed)
Radiation Oncology         [phone] ________________________________  Name: Kristen Mccoy MRN: [id number]  Date: 01/20/2019  DOB: December 27, 1940  Follow-Up Visit Note  CC: Monico Blitz, MD  Isabel Caprice, MD    ICD-10-CM   1. Endometrial cancer ([place]) C54.1     Diagnosis:   78 y.o. female with Stage II, grade 3 endometrial cancer with isolated tumor cells on sentinel node [pT2,pN0(i+)]  Interval Since Last Radiation:  6 months  Radiation treatment dates:   05/06/2018-06/10/2018;  HDR: 06/15/2018, 06/22/2018, and 06/29/2018 Site/dose:  1. Pelvis / 1.8 Gy x 25 fractions for a total dose of 45 Gy 2. Vaginal cuff / 6 Gy x 3 fractions for a total dose of 18 Gy  Narrative:  The patient returns today for routine follow-up.  She was last seen Dr. Gerarda Fraction in January 2020 with no evidence of disease on exam. Her follow-up CT C/A/P scans on 09/29/2018 also did not show any evidence of recurrent or metastatic endometrial carcinoma. However, there was an 11 mm mediastinal lymph node in the aortopulmonary window, which was nonspecific and likely reactive. The patient is scheduled to undergo repeat CT scans in July 2020.       On review of systems, the patient denies any pain. She reports occasional dysuria but denies hematuria. She denies vaginal bleeding or discharge. She denies rectal bleeding, diarrhea, or constipation                ALLERGIES:  is allergic to aspirin and penicillins.  Meds: Current Outpatient Medications  Medication Sig Dispense Refill  . albuterol (VENTOLIN HFA) 108 (90 Base) MCG/ACT inhaler     . amLODipine (NORVASC) 5 MG tablet Take 5 mg by mouth daily.    Marland Kitchen atorvastatin (LIPITOR) 20 MG tablet Take 20 mg by mouth daily.    Marland Kitchen dicyclomine (BENTYL) 20 MG tablet Take 1 tablet (20 mg total) by mouth every 6 (six) hours as needed for spasms (abdominal cramping). 15 tablet 0  . docusate sodium (COLACE) 100 MG capsule Take 1 capsule (100 mg total) by mouth 2 (two) times daily.  30 capsule 0  . dorzolamide (TRUSOPT) 2 % ophthalmic solution     . hydrochlorothiazide (HYDRODIURIL) 25 MG tablet Take 25 mg by mouth daily.    . hydroxypropyl methylcellulose / hypromellose (ISOPTO TEARS / GONIOVISC) 2.5 % ophthalmic solution Place 1 drop into both eyes at bedtime.    . metFORMIN (GLUCOPHAGE) 500 MG tablet Take 500 mg by mouth 2 (two) times daily with a meal.     . ONETOUCH DELICA LANCETS 53I MISC     . ONETOUCH VERIO test strip     . Riboflavin (VITAMIN B-2 PO) Take 125 mg by mouth daily.    . sitaGLIPtin (JANUVIA) 50 MG tablet Take 50 mg by mouth daily.    . Vitamin D, Ergocalciferol, (DRISDOL) 50000 UNITS CAPS capsule Take 50,000 Units by mouth every 7 (seven) days. Takes on Thursdays.     No current facility-administered medications for this encounter.     Physical Findings: The patient is in no acute distress. Patient is alert and oriented.  height is 5\' 1"  (1.549 m) and weight is 115 lb 6 oz (52.3 kg). Her oral temperature is 97.7 F (36.5 C). Her blood pressure is 154/65 (abnormal) and her pulse is 99. Her respiration is 18 and oxygen saturation is 98%.   Lungs are clear to auscultation bilaterally. Heart has regular rate and rhythm. No  palpable cervical, supraclavicular, or axillary adenopathy. Abdomen soft, non-tender, normal bowel sounds.  On pelvic examination the external genitalia were unremarkable. A speculum exam was performed. There are no mucosal lesions noted in the vaginal vault. On bimanual examination there were no pelvic masses appreciated.  Lab Findings: Lab Results  Component Value Date   WBC 10.0 03/12/2018   HGB 10.4 (L) 03/12/2018   HCT 33.0 (L) 03/12/2018   MCV 79.1 03/12/2018   PLT 244 03/12/2018    Radiographic Findings: No results found.  Impression:  Stage II, grade 3 endometrial cancer with isolated tumor cells on sentinel node [pT2,pN0(i+)]. No evidence of recurrence on clinical exam.   Plan:  Patient will follow up with  gynecologic oncology in 3 months. She will also have CT scans at that time. Follow up in radiation oncology in 6 months.  ____________________________________  Blair Promise, PhD, MD  This document serves as a record of services personally performed by Gery Pray, MD. It was created on his behalf by Rae Lips, a trained medical scribe. The creation of this record is based on the scribe's personal observations and the provider's statements to them. This document has been checked and approved by the attending provider.

## 2019-01-20 NOTE — Patient Instructions (Signed)
Coronavirus (COVID-19) Are you at risk?  Are you at risk for the Coronavirus (COVID-19)?  To be considered HIGH RISK for Coronavirus (COVID-19), you have to meet the following criteria:  . Traveled to [place], [place], [place], [place] or [place]; or in the [place] to [place], [place], [place], or [place]; and have fever, cough, and shortness of breath within the last 2 weeks of travel OR . Been in close contact with a person diagnosed with COVID-19 within the last 2 weeks and have fever, cough, and shortness of breath . IF YOU DO NOT MEET THESE CRITERIA, YOU ARE CONSIDERED LOW RISK FOR COVID-19.  What to do if you are HIGH RISK for COVID-19?  . If you are having a medical emergency, call 911. . Seek medical care right away. Before you go to a doctor's office, urgent care or emergency department, call ahead and tell them about your recent travel, contact with someone diagnosed with COVID-19, and your symptoms. You should receive instructions from your physician's office regarding next steps of care.  . When you arrive at healthcare provider, tell the healthcare staff immediately you have returned from visiting [place], [place], [place], [place] or [place]; or traveled in the [place] to [place], [place], [place], or [place]; in the last two weeks or you have been in close contact with a person diagnosed with COVID-19 in the last 2 weeks.   . Tell the health care staff about your symptoms: fever, cough and shortness of breath. . After you have been seen by a medical provider, you will be either: o Tested for (COVID-19) and discharged home on quarantine except to seek medical care if symptoms worsen, and asked to  - Stay home and avoid contact with others until you get your results (4-5 days)  - Avoid travel on public transportation if possible (such as bus, train, or airplane) or o Sent to the Emergency Department by EMS for evaluation, COVID-19 testing, and possible  admission depending on your condition and test results.  What to do if you are LOW RISK for COVID-19?  Reduce your risk of any infection by using the same precautions used for avoiding the common cold or flu:  . Wash your hands often with soap and warm water for at least 20 seconds.  If soap and water are not readily available, use an alcohol-based hand sanitizer with at least 60% alcohol.  . If coughing or sneezing, cover your mouth and nose by coughing or sneezing into the elbow areas of your shirt or coat, into a tissue or into your sleeve (not your hands). . Avoid shaking hands with others and consider head nods or verbal greetings only. . Avoid touching your eyes, nose, or mouth with unwashed hands.  . Avoid close contact with people who are sick. . Avoid places or events with large numbers of people in one location, like concerts or sporting events. . Carefully consider travel plans you have or are making. . If you are planning any travel outside or inside the [place], visit the CDC's Travelers' Health webpage for the latest health notices. . If you have some symptoms but not all symptoms, continue to monitor at home and seek medical attention if your symptoms worsen. . If you are having a medical emergency, call 911.   ADDITIONAL HEALTHCARE OPTIONS FOR PATIENTS  [facility] / e-Visit: [email or web address]         [facility]: [phone]  [facility]: [phone]                   [facility]: [phone]   

## 2019-02-22 ENCOUNTER — Telehealth: Payer: Self-pay | Admitting: *Deleted

## 2019-02-22 NOTE — Telephone Encounter (Signed)
Called patient to inform of fu with Dr. Skeet Latch on 03-24-19 - arrival time- 1:15 pm, spoke with patient and she is aware of this appt.

## 2019-02-22 NOTE — Telephone Encounter (Signed)
Enid Derry from radiation called and scheduled the patient for a follow up in July. She will contact the patient

## 2019-03-23 ENCOUNTER — Other Ambulatory Visit: Payer: Self-pay

## 2019-03-23 ENCOUNTER — Encounter (HOSPITAL_COMMUNITY): Payer: Self-pay

## 2019-03-23 ENCOUNTER — Inpatient Hospital Stay: Payer: Medicare Other | Attending: Obstetrics

## 2019-03-23 ENCOUNTER — Ambulatory Visit (HOSPITAL_COMMUNITY)
Admission: RE | Admit: 2019-03-23 | Discharge: 2019-03-23 | Disposition: A | Discharge status: Home / Self Care | Payer: Medicare Other | Source: Ambulatory Visit | Attending: Obstetrics | Admitting: Obstetrics

## 2019-03-23 DIAGNOSIS — C541 Malignant neoplasm of endometrium: Secondary | ICD-10-CM

## 2019-03-23 DIAGNOSIS — Z90722 Acquired absence of ovaries, bilateral: Secondary | ICD-10-CM | POA: Insufficient documentation

## 2019-03-23 DIAGNOSIS — C779 Secondary and unspecified malignant neoplasm of lymph node, unspecified: Secondary | ICD-10-CM | POA: Diagnosis not present

## 2019-03-23 DIAGNOSIS — Z90710 Acquired absence of both cervix and uterus: Secondary | ICD-10-CM | POA: Insufficient documentation

## 2019-03-23 LAB — BASIC METABOLIC PANEL - CANCER CENTER ONLY
Anion gap: 13 (ref 5–15)
BUN: 12 mg/dL (ref 8–23)
CO2: 25 mmol/L (ref 22–32)
Calcium: 8.9 mg/dL (ref 8.9–10.3)
Chloride: 103 mmol/L (ref 98–111)
Creatinine: 0.82 mg/dL (ref 0.44–1.00)
GFR, Est AFR Am: >60 mL/min (ref >60)
GFR, Estimated: >60 mL/min (ref >60)
Glucose, Bld: 123 mg/dL — ABNORMAL HIGH (ref 70–99)
Potassium: 3.7 mmol/L (ref 3.5–5.1)
Sodium: 141 mmol/L (ref 135–145)

## 2019-03-23 MED ORDER — IOHEXOL 300 MG/ML  SOLN
100.0000 mL | Freq: Once | INTRAMUSCULAR | Status: AC | PRN
  Administered 2019-03-23: 100 mL via INTRAVENOUS

## 2019-03-23 MED ORDER — SODIUM CHLORIDE (PF) 0.9 % IJ SOLN
INTRAMUSCULAR | Status: AC
  Filled 2019-03-23: qty 50

## 2019-03-24 ENCOUNTER — Inpatient Hospital Stay: Payer: Medicare Other | Admitting: Gynecologic Oncology

## 2019-03-24 NOTE — Progress Notes (Deleted)
[place] at [facility]   Progress Note : Established Patient Follow-Up  Consult was originally requested by Dr. Nat Math for uterine cancer  CC:  No chief complaint on file.  Gyn Oncologic Summary 1. 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  03/11/18 RA-TLH/BSO/SLNBx/PLND/PALND, Luther Redo  04/2018 - 06/29/18 EBRT 45 Gy and HDR vag brachy x 5  2. MMR normal/MSI stable   HPI: Ms. Kristen Mccoy  is a very nice 78 y.o. P6 (with 2 living children)  . Interval History  Since her last visit followup CTScan - 09/2018 - NED but there is a mediastinal node with recommendations to follow in 6 months. She completed her radiation the day after seeing me last. She denies bleeding, denies pain. She has plans to see Dr. Sondra Come in April 2020 (ie 3 months from today)  . Presenting History (Oncologic Course if applicable) Patient went to the emergency room complaining of pelvic pain states she had CT imaging and was told everything was normal.  She then followed up at [facility] complaining of abdominal pain, nausea, and painful BM. 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  She then followed up with Dr. Adah Perl.  Both a Pap smear and endometrial biopsy were obtained.  Both show adenocarcinoma.  The endometrial biopsy 02/24/2018 states the tissue is poorly differentiated.  We did have the cytology addended looking for HPV and that is noted to be HPV high risk not detected.  The patient is therefore referred for management.  Surprisingly she denied postmenopausal bleeding until her endometrial biopsy was performed.  She denies nausea and vomiting denies changes in bowel or bladder function.  She does note a 6 pound weight loss over the past 6 months.  Positive for early satiety.  Positive for loss of appetite.  She has pelvic  pain that can be as severe as 10 out of 10 this is been present for 3 weeks.  Since her last visit she underwent surgery on 03/11/18 as follows: Robotic-assisted TLH/BSO / Sentinel lymph node injection and biopsy/ Pelvic washings/  Pelvic lymphadenectomy/ Paraaortic lymphadenectomy There were no perioperative complications.   Final pathology revealed with pertinent highlights bolded: 1. 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 - CARCINOMA INVADES FOR A DEPTH OF 1.3 CM WHERE MYOMETRIAL THICKNESS IS 1.8 CM (GREATER THAN 50%). - UTERINE SEROSA NOT INVOLVED. - NEGATIVE FOR LYMPHOVASCULAR INVASION. - BENIGN BILATERAL FALLOPIAN TUBES AND OVARIES. - SEE ONCOLOGY TABLE. - SEE NOTE 2. Lymph node, sentinel, biopsy, left external iliac - LYMPH NODE WITH ISOLATED TUMOR CELLS (ITC) - IMMUNOHISTOCHEMICAL STAIN FOR PANKERATIN IS POSITIVE FOR ISOLATED TUMOR CELLS 3. Lymph node, sentinel, biopsy, left obturator - LYMPH NODE, NEGATIVE FOR CARCINOMA (0/1). - IMMUNOHISTOCHEMICAL STAIN FOR PANKERATIN IS NEGATIVE 4. Lymph node, sentinel, biopsy, left proximal external iliac - LYMPH NODE, NEGATIVE FOR CARCINOMA (0/1). - IMMUNOHISTOCHEMICAL STAIN FOR PANKERATIN IS NEGATIVE 5. Lymph node, sentinel, biopsy, right obturator - LYMPH NODE, NEGATIVE FOR CARCINOMA (0/1). - IMMUNOHISTOCHEMICAL STAIN FOR PANKERATIN IS NEGATIVE 6. Lymph node, sentinel, biopsy, right proximal external iliac - LYMPH NODE, NEGATIVE FOR CARCINOMA (0/1). - IMMUNOHISTOCHEMICAL STAIN FOR PANKERATIN IS NEGATIVE 7. Lymph nodes, regional resection, left pelvic - FIVE LYMPH NODES, NEGATIVE FOR CARCINOMA (0/5). 8. Lymph  nodes, regional resection, right pelvic - THREE LYMPH NODES, NEGATIVE FOR CARCINOMA (0/3). 9. Lymph node, sentinel, biopsy, presacral - LYMPH NODE, NEGATIVE FOR CARCINOMA  (0/1). IMMUNOHISTOCHEMICAL STAIN FOR PANKERATIN IS NEGATIVE 10. Lymph node, biopsy, right peri-aortic - LYMPH NODE, NEGATIVE FOR CARCINOMA (0/1). 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  Oncologic History:   Oncology History   No history exists.    Measurement of disease:  . Will be clinical exam and imaging PRN  Radiology:   CXR 03/10/18 NED  02/15/18 - CTAP - Lower chest: No acute abnormality. Hepatobiliary: No focal liver abnormality is seen. Diffuse low attenuation of the liver as can be seen with hepatic steatosis. No gallstones, gallbladder wall thickening, or biliary dilatation. Pancreas: Unremarkable. No pancreatic ductal dilatation or surrounding inflammatory changes. Spleen: Normal in size without focal abnormality. Adrenals/Urinary Tract: Normal adrenal glands. Stable right renal cysts. No solid renal mass. No urolithiasis or obstructive uropathy. Normal bladder. Stomach/Bowel: Stomach is within normal limits. No evidence of bowel wall thickening, distention, or inflammatory changes. Vascular/Lymphatic: Normal caliber abdominal aorta with mild atherosclerosis. No lymphadenopathy. Reproductive: No adnexal mass. 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 Other: No fluid collection or hematoma. Musculoskeletal: No acute osseous abnormality. No aggressive osseous lesion. Mild osteoarthritis of bilateral sacroiliac joints, right worse than left Ct Chest W Contrast  Result Date: 03/23/2019 CLINICAL DATA:  Restaging endometrial cancer. Status post hysterectomy and chemotherapy. EXAM: CT CHEST, ABDOMEN, AND PELVIS WITH CONTRAST TECHNIQUE: Multidetector CT imaging of the chest, abdomen and pelvis was performed following the standard protocol during  bolus administration of intravenous contrast. CONTRAST:  173m OMNIPAQUE IOHEXOL 300 MG/ML  SOLN COMPARISON:  09/29/2018 FINDINGS: CT CHEST FINDINGS Cardiovascular: The heart is normal in size. Small amount of stable pericardial fluid without overt effusion. The aorta is normal in caliber. No dissection. The branch vessels are patent. Stable coronary artery calcifications. 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 Mediastinum/Nodes: Stable mediastinal lymph nodes. 9 mm precarinal lymph node on image number 28 is stable. 11 mm AP window node on image number 26 is stable. Small hilar lymph nodes are also unchanged. No new or progressive findings. The esophagus is grossly normal. Lungs/Pleura: No acute pulmonary findings or worrisome pulmonary nodules. No pleural effusion. Mild stable bilateral lower lobe bronchiectasis, likely postinflammatory/postinfectious. Musculoskeletal: No breast masses, supraclavicular or axillary adenopathy. The thyroid gland appears normal. The bony structures are intact. CT ABDOMEN PELVIS FINDINGS Hepatobiliary: No focal hepatic lesions or intrahepatic biliary dilatation. The gallbladder is normal. No common bile duct dilatation. Pancreas: No mass, inflammation or ductal dilatation. Spleen: Normal size.  No focal lesions. Adrenals/Urinary Tract: The adrenal glands and kidneys are unremarkable. Stable right renal cysts. The bladder is normal. Stomach/Bowel: The stomach, duodenum, small bowel and colon are unremarkable. No acute inflammatory changes, mass lesions or obstructive findings. Vascular/Lymphatic: The aorta is normal in caliber. No dissection. The branch vessels are patent. The major venous structures are patent. No mesenteric or retroperitoneal mass or adenopathy. Small scattered lymph nodes are noted. Reproductive: Surgically absent Other: No pelvic mass or pelvic lymphadenopathy. No inguinal lymphadenopathy.  No omental or peritoneal surface lesions are identified. Musculoskeletal: No significant bony findings. IMPRESSION: 1. Stable mediastinal and hilar lymph nodes. No new  or progressive findings. 2. No pulmonary nodules to suggest pulmonary metastatic disease. No pleural effusion or pleural nodules. 3. Stable bilateral lower lobe bronchiectasis. 4. No CT findings for abdominal/pelvic adenopathy or metastatic disease. Electronically Signed   By: Marijo Sanes M.D.   On: 03/23/2019 11:21   Ct Abdomen Pelvis W Contrast  Result Date: 03/23/2019 CLINICAL DATA:  Restaging endometrial cancer. Status post hysterectomy and chemotherapy. EXAM: CT CHEST, ABDOMEN, AND PELVIS WITH CONTRAST TECHNIQUE: Multidetector CT imaging of the chest, abdomen and pelvis was performed following the standard protocol during bolus administration of intravenous contrast. CONTRAST:  166m OMNIPAQUE IOHEXOL 300 MG/ML  SOLN COMPARISON:  09/29/2018 FINDINGS: CT CHEST FINDINGS Cardiovascular: The heart is normal in size. Small amount of stable pericardial fluid without overt effusion. The aorta is normal in caliber. No dissection. The branch vessels are patent. Stable coronary artery calcifications. 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 Mediastinum/Nodes: Stable mediastinal lymph nodes. 9 mm precarinal lymph node on image number 28 is stable. 11 mm AP window node on image number 26 is stable. Small hilar lymph nodes are also unchanged. No new or progressive findings. The esophagus is grossly normal. Lungs/Pleura: No acute pulmonary findings or worrisome pulmonary nodules. No pleural effusion. Mild stable bilateral lower lobe bronchiectasis, likely postinflammatory/postinfectious. Musculoskeletal: No breast masses, supraclavicular or axillary adenopathy. The thyroid gland appears normal. The bony structures are intact. CT ABDOMEN PELVIS FINDINGS Hepatobiliary: No focal hepatic  lesions or intrahepatic biliary dilatation. The gallbladder is normal. No common bile duct dilatation. Pancreas: No mass, inflammation or ductal dilatation. Spleen: Normal size.  No focal lesions. Adrenals/Urinary Tract: The adrenal glands and kidneys are unremarkable. Stable right renal cysts. The bladder is normal. Stomach/Bowel: The stomach, duodenum, small bowel and colon are unremarkable. No acute inflammatory changes, mass lesions or obstructive findings. Vascular/Lymphatic: The aorta is normal in caliber. No dissection. The branch vessels are patent. The major venous structures are patent. No mesenteric or retroperitoneal mass or adenopathy. Small scattered lymph nodes are noted. Reproductive: Surgically absent Other: No pelvic mass or pelvic lymphadenopathy. No inguinal lymphadenopathy. No omental or peritoneal surface lesions are identified. Musculoskeletal: No significant bony findings. IMPRESSION: 1. Stable mediastinal and hilar lymph nodes. No new or progressive findings. 2. No pulmonary nodules to suggest pulmonary metastatic disease. No pleural effusion or pleural nodules. 3. Stable bilateral lower lobe bronchiectasis. 4. No CT findings for abdominal/pelvic adenopathy or metastatic disease. Electronically Signed   By: PMarijo SanesM.D.   On: 03/23/2019 11:21       Current Meds:  Outpatient Encounter Medications as of 03/24/2019  Medication Sig  . albuterol (VENTOLIN HFA) 108 (90 Base) MCG/ACT inhaler   . amLODipine (NORVASC) 5 MG tablet Take 5 mg by mouth daily.  .Marland Kitchenatorvastatin (LIPITOR) 20 MG tablet Take 20 mg by mouth daily.  .Marland Kitchendicyclomine (BENTYL) 20 MG tablet Take 1 tablet (20 mg total) by mouth every 6 (six) hours as needed for spasms (abdominal cramping).  .Marland Kitchendocusate sodium (COLACE) 100 MG capsule Take 1 capsule (100 mg total) by mouth 2 (two) times daily.  . dorzolamide (TRUSOPT) 2 % ophthalmic solution   . hydrochlorothiazide (HYDRODIURIL) 25 MG tablet Take 25 mg by mouth daily.   . hydroxypropyl methylcellulose / hypromellose (ISOPTO TEARS / GONIOVISC) 2.5 % ophthalmic solution Place 1 drop into both eyes at bedtime.  . metFORMIN (GLUCOPHAGE) 500 MG tablet Take 500 mg by mouth 2 (two) times daily  with a meal.   . ONETOUCH DELICA LANCETS 28Z MISC   . ONETOUCH VERIO test strip   . Riboflavin (VITAMIN B-2 PO) Take 125 mg by mouth daily.  . sitaGLIPtin (JANUVIA) 50 MG tablet Take 50 mg by mouth daily.  . Vitamin D, Ergocalciferol, (DRISDOL) 50000 UNITS CAPS capsule Take 50,000 Units by mouth every 7 (seven) days. Takes on Thursdays.   No facility-administered encounter medications on file as of 03/24/2019.     Allergy:  Allergies  Allergen Reactions  . Aspirin Itching and Rash    swollen   . Penicillins Other (See Comments)    RECEIVED cephalosporin in OR 03/11/18 without incident PCN no problem as a child, but then as adult "my mouth went sideway"  I reviewed her PCN allergy. I do not believe this is a true allergy to penicillin. States she had it when she was younger without problem. We gave her cephalosporin based antibiotic in the OR 03/11/18  Social Hx:  Tobacco use: None Alcohol use: None Illicit Drug use: None Illicit IV Drug use: None  Past Surgical Hx:  Past Surgical History:  Procedure Laterality Date  . ABDOMINAL HYSTERECTOMY     total hysterectomy Dr. Gerarda Fraction 03-11-18  . APPENDECTOMY     Open HOWEVER incision is in RUQ  . COLONOSCOPY N/A 12/14/2013   Procedure: COLONOSCOPY;  Surgeon: Rogene Houston, MD;  Location: [facility];  Service: Endoscopy;  Laterality: N/A;  100  . ESOPHAGOGASTRODUODENOSCOPY N/A 12/26/2015   Procedure: ESOPHAGOGASTRODUODENOSCOPY (EGD);  Surgeon: Rogene Houston, MD;  Location: [facility];  Service: Endoscopy;  Laterality: N/A;  2:45  . ROBOTIC ASSISTED TOTAL HYSTERECTOMY WITH BILATERAL SALPINGO OOPHERECTOMY N/A 03/11/2018   Procedure: XI ROBOTIC ASSISTED TOTAL HYSTERECTOMY WITH BILATERAL SALPINGO OOPHORECTOMY, WITH  STAGING;  Surgeon: Isabel Caprice, MD;  Location: [facility];  Service: Gynecology;  Laterality: N/A;  . SENTINEL NODE BIOPSY N/A 03/11/2018   Procedure: SENTINEL NODE BIOPSY;  Surgeon: Isabel Caprice, MD;  Location: [facility];  Service: Gynecology;  Laterality: N/A;  . TUBAL LIGATION      Past Medical Hx:  Past Medical History:  Diagnosis Date  . Arthritis   . Diabetes ([place])     type 2  . Endometrial cancer ([place]) 02/24/2018  . Hypertension    x 5 yr  . Pneumonia     Past Gynecological History:   GYNECOLOGIC HISTORY:  No LMP recorded. Patient has had a hysterectomy. Menarche: 78 years old P 6016 (only has 2 living children) LMP 50 Contraceptive< 10 years oral contraceptive HRT none  Last Pap states 2017 by PCP and was called and told it was normal  Family Hx:  Family History  Problem Relation Age of Onset  . Diabetes Mother   . Diabetes Brother   . Colon cancer Neg Hx     Review of Systems:  Review of Systems  Respiratory: Positive for cough.   Cardiovascular: Positive for leg swelling.  Genitourinary: Positive for frequency.   All other systems reviewed and are negative. Feeling full quickly  Vitals:  There were no vitals taken for this visit. There is no height or weight on file to calculate BMI.   Physical Exam:  ECOG PERFORMANCE STATUS: 0 - Asymptomatic  General :  Well developed, 78 y.o., female in no apparent distress HEENT:  Normocephalic/atraumatic, symmetric, EOMI, eyelids normal Neck:   No visible masses.  Respiratory:  Respirations unlabored, no use of accessory muscles CV:   Deferred Breast:  Deferred Musculoskeletal: Normal muscle strength. Abdomen:  Trocar sites are soft without induration. No visible masses or protrusion Extremities:  No visible edema or deformities Skin:   Normal inspection Neuro/Psych:  No focal motor deficit, no abnormal mental status. Normal gait. Normal affect. Alert and oriented to person, place, and time  Genito  Urinary: Vulva: Normal external female genitalia.  Bladder/urethra: Urethral meatus normal in size and location. No lesions or   masses, well supported bladder Speculum exam: Vagina: No lesion, no discharge, no bleeding. Shortened vagina with central fibroisis Bimanual exam: Cervix/Uterus/Adnexa: Surgically absent  Adnexa: No masses. Rectovaginal:  Good tone, no masses, no cul de sac nodularity, no parametrial involvement or nodularity.   Assessment: Stage 2 G3 EM Uterine CA with +ITC on sentinel lymph node x 1.  Plan: 1. Counseling for uterine cancer ? She has ITC in one of the lymph nodes.  ? We discussed previously this may increase our concern for recurrence, but we have no data to alter treatment recommendations because of this.  ? She had full nodal dissection in addition to the sentinel nodes. ? Therefore I will not recommend chemotherapy ? Given the cervical stromal involvement I recommended and she has completed radiation with EBRT to the pelvis and brachytherapy to the vagina ? She was presented at Multidisciplinary Tumor Board which agreed with above 2. Surveillance plan ? Q3 months pelvic (alternating with Dr. Sondra Come)  ? She will be seeing Dr. Sondra Come in 3 months then back to see me in 6 months ? No pap smears in surveillance ? Plan imaging CT CAP Q6 mo x 2 years; will plan next scan June/July 2020 just before she is back to see me. 3. Vaginal dilator - to continue indefinitely  Face to face time with patient was 15 minutes. Over 50% of this time was spent on counseling and coordination of care.   Janie Morning, MD  03/24/2019, 8:32 AM  Cc: Nat Math, MD (Referring OB/GYN) Russella Dar, MD (PCP) Gery Pray, MD (Radiation Oncology)

## 2019-03-30 ENCOUNTER — Telehealth: Payer: Self-pay

## 2019-03-30 NOTE — Telephone Encounter (Signed)
Told Kristen Mccoy that the CT of the chest shows stable lymph nodes. No new or progressive findings. No CT findings for abdominal/pelvic adenopathy or metastatic disease per [name], NP.  Pt cannot com to appointments on thursdays.  She does not have transportation.  She can come on M-W-F.  She cares for her mother as well.

## 2019-05-06 ENCOUNTER — Telehealth: Payer: Self-pay | Admitting: *Deleted

## 2019-05-06 NOTE — Telephone Encounter (Signed)
Called and set the patient up to see Dr. Alycia Rossetti in Sept

## 2019-05-29 NOTE — Progress Notes (Signed)
Consult Note: Gyn-Onc  Kristen Mccoy 78 y.o. female  CC:  Chief Complaint  Patient presents with  . Follow-up    HPI: Patient went to the emergency room complaining of pelvic pain states she had CT imaging and was told everything was normal.  She then followed up at [facility] complaining of abdominal pain, nausea, and painful BM. 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  She then followed up with Dr. Adah Perl.  Both a Pap smear and endometrial biopsy were obtained.  Both show adenocarcinoma.  The endometrial biopsy 02/24/2018 states the tissue is poorly differentiated.  We did have the cytology addended looking for HPV and that is noted to be HPV high risk not detected.  03/11/18 as follows: Robotic-assisted TLH/BSO / Sentinel lymph node injection andbiopsy/ Pelvic washings/  Pelvic lymphadenectomy/ Paraaortic lymphadenectomy Final pathology revealed with pertinent highlights bolded: 1. 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 - CARCINOMA INVADES FOR A DEPTH OF 1.3 CM WHERE MYOMETRIAL THICKNESS IS 1.8 CM (GREATER THAN 50%). - UTERINE SEROSA NOT INVOLVED. - NEGATIVE FOR LYMPHOVASCULAR INVASION. - BENIGN BILATERAL FALLOPIAN TUBES AND OVARIES. 2. Lymph node, sentinel, biopsy, left external iliac - LYMPH NODE WITH ISOLATED TUMOR CELLS (ITC) - IMMUNOHISTOCHEMICAL STAIN FOR PANKERATIN IS POSITIVE FOR ISOLATED TUMOR CELLS 3. Lymph node, sentinel, biopsy, left obturator - LYMPH NODE, NEGATIVE FOR CARCINOMA (0/1). - IMMUNOHISTOCHEMICAL STAIN FOR PANKERATIN IS NEGATIVE 4. Lymph node, sentinel, biopsy, left proximal external iliac - LYMPH NODE, NEGATIVE FOR CARCINOMA (0/1). - IMMUNOHISTOCHEMICAL STAIN FOR PANKERATIN IS NEGATIVE 5. Lymph node, sentinel, biopsy,  right obturator - LYMPH NODE, NEGATIVE FOR CARCINOMA (0/1). - IMMUNOHISTOCHEMICAL STAIN FOR PANKERATIN IS NEGATIVE 6. Lymph node, sentinel, biopsy, right proximal external iliac - LYMPH NODE, NEGATIVE FOR CARCINOMA (0/1). - IMMUNOHISTOCHEMICAL STAIN FOR PANKERATIN IS NEGATIVE 7. Lymph nodes, regional resection, left pelvic - FIVE LYMPH NODES, NEGATIVE FOR CARCINOMA (0/5). 8. Lymph nodes, regional resection, right pelvic - THREE LYMPH NODES, NEGATIVE FOR CARCINOMA (0/3). 9. Lymph node, sentinel, biopsy, presacral - LYMPH NODE, NEGATIVE FOR CARCINOMA (0/1). IMMUNOHISTOCHEMICAL STAIN FOR PANKERATIN IS NEGATIVE 10. Lymph node, biopsy, right peri-aortic - LYMPH NODE, NEGATIVE FOR CARCINOMA (0/1).  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 She was seen by Dr. Gerarda Fraction 1/20 and by Dr. Sondra Come 4/20. She has completed external beam radiation and vaginal cuff brachytherapy.  Interval History:  CTScan - 09/2018 - NED but there is a mediastinal node with recommendations to follow in 6 months.  CT scan 7/20 IMPRESSION: 1. Stable mediastinal and hilar lymph nodes. No new or progressive findings. 2. No pulmonary nodules to suggest pulmonary metastatic disease. No pleural effusion or pleural nodules. 3. Stable bilateral lower lobe bronchiectasis. 4. No CT findings for abdominal/pelvic adenopathy or metastatic disease.  She comes in today for follow-up.  She is overall doing well.  She states there are no new medical problems in her family.  She has no questions or concerns for me.  She helps take care of her mother who is 67 years old. Review of Systems  Constitutional:  Denies fever.  States busy as she takes care of her mom who is 75 years old. Skin: No rash, sores, jaundice, itching, or dryness.  Cardiovascular: No chest pain, shortness of  breath Pulmonary: No cough Gastro Intestinal: No nausea, vomiting, constipation, or  diarrhea reported.  Genitourinary: She does note some urinary incontinence when her bladder is full and she is walking.  I asked her if it bothered her and she states no that she rarely does any walking.  Denies vaginal bleeding and discharge.  Musculoskeletal: No myalgia, arthralgia, joint swelling or pain.  Psychology: No complaints.  Current Meds:  Outpatient Encounter Medications as of 06/01/2019  Medication Sig  . albuterol (VENTOLIN HFA) 108 (90 Base) MCG/ACT inhaler   . amLODipine (NORVASC) 5 MG tablet Take 5 mg by mouth daily.  Marland Kitchen atorvastatin (LIPITOR) 20 MG tablet Take 20 mg by mouth daily.  Marland Kitchen dicyclomine (BENTYL) 20 MG tablet Take 1 tablet (20 mg total) by mouth every 6 (six) hours as needed for spasms (abdominal cramping).  Marland Kitchen docusate sodium (COLACE) 100 MG capsule Take 1 capsule (100 mg total) by mouth 2 (two) times daily.  . dorzolamide (TRUSOPT) 2 % ophthalmic solution   . hydrochlorothiazide (HYDRODIURIL) 25 MG tablet Take 25 mg by mouth daily.  . hydroxypropyl methylcellulose / hypromellose (ISOPTO TEARS / GONIOVISC) 2.5 % ophthalmic solution Place 1 drop into both eyes at bedtime.  . metFORMIN (GLUCOPHAGE) 500 MG tablet Take 500 mg by mouth 2 (two) times daily with a meal.   . ONETOUCH DELICA LANCETS 99991111 MISC   . ONETOUCH VERIO test strip   . Riboflavin (VITAMIN B-2 PO) Take 125 mg by mouth daily.  . sitaGLIPtin (JANUVIA) 50 MG tablet Take 50 mg by mouth daily.  . Vitamin D, Ergocalciferol, (DRISDOL) 50000 UNITS CAPS capsule Take 50,000 Units by mouth every 7 (seven) days. Takes on Thursdays.   No facility-administered encounter medications on file as of 06/01/2019.     Allergy:  Allergies  Allergen Reactions  . Aspirin Itching and Rash    swollen   . Penicillins Other (See Comments)    RECEIVED cephalosporin in OR 03/11/18 without incident PCN no problem as a child, but then as adult "my mouth went sideway"    Social Hx:   Social History   Socioeconomic History   . Marital status: Widowed    Spouse name: Not on file  . Number of children: Not on file  . Years of education: Not on file  . Highest education level: Not on file  Occupational History  . Not on file  Social Needs  . Financial resource strain: Not on file  . Food insecurity    Worry: Not on file    Inability: Not on file  . Transportation needs    Medical: No    Non-medical: No  Tobacco Use  . Smoking status: Never Smoker  . Smokeless tobacco: Never Used  Substance and Sexual Activity  . Alcohol use: No  . Drug use: No  . Sexual activity: Yes  Lifestyle  . Physical activity    Days per week: Not on file    Minutes per session: Not on file  . Stress: Not on file  Relationships  . Social Herbalist on phone: Not on file    Gets together: Not on file    Attends religious service: Not on file    Active member of club or organization: Not on file    Attends meetings of clubs or organizations: Not on file    Relationship status: Not on file  . Intimate partner violence    Fear of current or ex partner: No  Emotionally abused: No    Physically abused: No    Forced sexual activity: No  Other Topics Concern  . Not on file  Social History Narrative  . Not on file    Past Surgical Hx:  Past Surgical History:  Procedure Laterality Date  . ABDOMINAL HYSTERECTOMY     total hysterectomy Dr. Gerarda Fraction 03-11-18  . APPENDECTOMY     Open HOWEVER incision is in RUQ  . COLONOSCOPY N/A 12/14/2013   Procedure: COLONOSCOPY;  Surgeon: Rogene Houston, MD;  Location[facility];  Service: Endoscopy;  Laterality: N/A;  100  . ESOPHAGOGASTRODUODENOSCOPY N/A 12/26/2015   Procedure: ESOPHAGOGASTRODUODENOSCOPY (EGD);  Surgeon: Rogene Houston, MD;  Location: [facility];  Service: Endoscopy;  Laterality: N/A;  2:45  . ROBOTIC ASSISTED TOTAL HYSTERECTOMY WITH BILATERAL SALPINGO OOPHERECTOMY N/A 03/11/2018   Procedure: XI ROBOTIC ASSISTED TOTAL HYSTERECTOMY WITH BILATERAL  SALPINGO OOPHORECTOMY, WITH STAGING;  Surgeon: Isabel Caprice, MD;  Location: [facility];  Service: Gynecology;  Laterality: N/A;  . SENTINEL NODE BIOPSY N/A 03/11/2018   Procedure: SENTINEL NODE BIOPSY;  Surgeon: Isabel Caprice, MD;  Location: [facility];  Service: Gynecology;  Laterality: N/A;  . TUBAL LIGATION      Past Medical Hx:  Past Medical History:  Diagnosis Date  . Arthritis   . Diabetes ([place])     type 2  . Endometrial cancer ([place]) 02/24/2018  . Hypertension    x 5 yr  . Pneumonia     Oncology Hx:  Oncology History   No history exists.    Family Hx:  Family History  Problem Relation Age of Onset  . Diabetes Mother   . Diabetes Brother   . Colon cancer Neg Hx     Vitals:  Blood pressure (!) 154/59, pulse 98, temperature 98.5 F (36.9 C), temperature source Tympanic, resp. rate 20, height 5\' 1"  (1.549 m), weight 119 lb (54 kg).  Physical Exam: Well-nourished well-developed female in no acute distress.  She appears younger than stated age.    Neck: Supple, no lymphadenopathy, no thyromegaly.  Abdomen: Well-healed laparoscopic incisions.  Abdomen is soft, nontender, nondistended.  There are no palpable masses or hepatosplenomegaly.  Groins: No lymphadenopathy.  Extremities: 1+ edema around the right ankle.  Pelvic: External genitalia within normal limits.  The vagina is markedly atrophic.  The vaginal cuff is visualized there are no visible lesions.  Bimanual examination shows no masses or nodularity.  Rectal confirms.  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 She is s/p radiation.  Her one year post treatment CT scan was negative in 7/20. However, there is a mediastinal node. Would recommend a CT scan in 6 months. She will follow up with Dr. Sondra Come in 3 months and return to see [place] in 6 months with a CT scan.     Cecillia Menees A.,  MD 06/01/2019, 11:00 AM

## 2019-06-01 ENCOUNTER — Other Ambulatory Visit: Payer: Self-pay

## 2019-06-01 ENCOUNTER — Other Ambulatory Visit: Payer: 59

## 2019-06-01 ENCOUNTER — Inpatient Hospital Stay: Payer: Medicare Other | Attending: Obstetrics | Admitting: Gynecologic Oncology

## 2019-06-01 VITALS — BP 154/59 | HR 98 | Temp 98.5°F | Resp 20 | Ht 61.0 in | Wt 119.0 lb

## 2019-06-01 DIAGNOSIS — Z7984 Long term (current) use of oral hypoglycemic drugs: Secondary | ICD-10-CM | POA: Insufficient documentation

## 2019-06-01 DIAGNOSIS — Z79899 Other long term (current) drug therapy: Secondary | ICD-10-CM | POA: Insufficient documentation

## 2019-06-01 DIAGNOSIS — I10 Essential (primary) hypertension: Secondary | ICD-10-CM | POA: Insufficient documentation

## 2019-06-01 DIAGNOSIS — M1990 Unspecified osteoarthritis, unspecified site: Secondary | ICD-10-CM | POA: Diagnosis not present

## 2019-06-01 DIAGNOSIS — Z90722 Acquired absence of ovaries, bilateral: Secondary | ICD-10-CM | POA: Diagnosis not present

## 2019-06-01 DIAGNOSIS — E119 Type 2 diabetes mellitus without complications: Secondary | ICD-10-CM | POA: Diagnosis not present

## 2019-06-01 DIAGNOSIS — Z923 Personal history of irradiation: Secondary | ICD-10-CM | POA: Insufficient documentation

## 2019-06-01 DIAGNOSIS — R590 Localized enlarged lymph nodes: Secondary | ICD-10-CM | POA: Diagnosis not present

## 2019-06-01 DIAGNOSIS — Z90710 Acquired absence of both cervix and uterus: Secondary | ICD-10-CM

## 2019-06-01 DIAGNOSIS — C541 Malignant neoplasm of endometrium: Secondary | ICD-10-CM

## 2019-06-01 NOTE — Patient Instructions (Signed)
Please follow-up with Dr. Bobbe Medico in 3 months and return to be seen by GYN oncology in 6 months.  We will get a CT scan prior to that visit.  This is to follow-up that lymph node that is recently been stable.

## 2019-06-14 ENCOUNTER — Telehealth: Payer: Self-pay | Admitting: *Deleted

## 2019-06-14 NOTE — Telephone Encounter (Signed)
CALLED PATIENT TO ALTER FU FROM 07-21-19 TO 09-05-19 @ 10:45 AM WITH DR. Sondra Come, PATIENT'S NIECE [facility])  AGREED TO NEW APPT. DATE AND TIME

## 2019-07-21 ENCOUNTER — Ambulatory Visit: Payer: Self-pay | Admitting: Radiation Oncology

## 2019-09-05 ENCOUNTER — Encounter: Payer: Self-pay | Admitting: Radiation Oncology

## 2019-09-05 ENCOUNTER — Other Ambulatory Visit: Payer: Self-pay

## 2019-09-05 ENCOUNTER — Ambulatory Visit
Admission: RE | Admit: 2019-09-05 | Discharge: 2019-09-05 | Disposition: A | Discharge status: Home / Self Care | Payer: Medicare Other | Source: Ambulatory Visit | Attending: Radiation Oncology | Admitting: Radiation Oncology

## 2019-09-05 VITALS — BP 156/73 | HR 108 | Temp 98.7°F | Resp 18 | Ht 61.0 in | Wt 120.5 lb

## 2019-09-05 DIAGNOSIS — Z923 Personal history of irradiation: Secondary | ICD-10-CM | POA: Diagnosis not present

## 2019-09-05 DIAGNOSIS — Z79899 Other long term (current) drug therapy: Secondary | ICD-10-CM | POA: Insufficient documentation

## 2019-09-05 DIAGNOSIS — Z8542 Personal history of malignant neoplasm of other parts of uterus: Secondary | ICD-10-CM | POA: Diagnosis not present

## 2019-09-05 DIAGNOSIS — C541 Malignant neoplasm of endometrium: Secondary | ICD-10-CM

## 2019-09-05 DIAGNOSIS — Z7984 Long term (current) use of oral hypoglycemic drugs: Secondary | ICD-10-CM | POA: Insufficient documentation

## 2019-09-05 NOTE — Progress Notes (Signed)
Pt presents today for f/u with Dr. Sondra Come. Pt denies c/o pain. Pt denies dysuria/hematuria. Pt denies vaginal bleeding/discharge. Pt denies rectal bleeding, diarrhea/constipation. Pt denies abdominal bloating, N/V.   BP (!) 156/73 (BP Location: Left Arm, Patient Position: Sitting)   Pulse (!) 108   Temp 98.7 F (37.1 C) (Temporal)   Resp 18   Ht 5\' 1"  (1.549 m)   Wt 120 lb 8 oz (54.7 kg)   SpO2 98%   BMI 22.77 kg/m   Wt Readings from Last 3 Encounters:  09/05/19 120 lb 8 oz (54.7 kg)  06/01/19 119 lb (54 kg)  01/20/19 115 lb 6 oz (52.3 kg)   Loma Sousa, RN BSN

## 2019-09-05 NOTE — Patient Instructions (Signed)
Coronavirus (COVID-19) Are you at risk?  Are you at risk for the Coronavirus (COVID-19)?  To be considered HIGH RISK for Coronavirus (COVID-19), you have to meet the following criteria:  . Traveled to [place], [place], [place], [place] or [place]; or in the [place] to [place], [place], [place], or [place]; and have fever, cough, and shortness of breath within the last 2 weeks of travel OR . Been in close contact with a person diagnosed with COVID-19 within the last 2 weeks and have fever, cough, and shortness of breath . IF YOU DO NOT MEET THESE CRITERIA, YOU ARE CONSIDERED LOW RISK FOR COVID-19.  What to do if you are HIGH RISK for COVID-19?  . If you are having a medical emergency, call 911. . Seek medical care right away. Before you go to a doctor's office, urgent care or emergency department, call ahead and tell them about your recent travel, contact with someone diagnosed with COVID-19, and your symptoms. You should receive instructions from your physician's office regarding next steps of care.  . When you arrive at healthcare provider, tell the healthcare staff immediately you have returned from visiting [place], [place], [place], [place] or [place]; or traveled in the [place] to [place], [place], [place], or [place]; in the last two weeks or you have been in close contact with a person diagnosed with COVID-19 in the last 2 weeks.   . Tell the health care staff about your symptoms: fever, cough and shortness of breath. . After you have been seen by a medical provider, you will be either: o Tested for (COVID-19) and discharged home on quarantine except to seek medical care if symptoms worsen, and asked to  - Stay home and avoid contact with others until you get your results (4-5 days)  - Avoid travel on public transportation if possible (such as bus, train, or airplane) or o Sent to the Emergency Department by EMS for evaluation, COVID-19 testing, and possible  admission depending on your condition and test results.  What to do if you are LOW RISK for COVID-19?  Reduce your risk of any infection by using the same precautions used for avoiding the common cold or flu:  . Wash your hands often with soap and warm water for at least 20 seconds.  If soap and water are not readily available, use an alcohol-based hand sanitizer with at least 60% alcohol.  . If coughing or sneezing, cover your mouth and nose by coughing or sneezing into the elbow areas of your shirt or coat, into a tissue or into your sleeve (not your hands). . Avoid shaking hands with others and consider head nods or verbal greetings only. . Avoid touching your eyes, nose, or mouth with unwashed hands.  . Avoid close contact with people who are sick. . Avoid places or events with large numbers of people in one location, like concerts or sporting events. . Carefully consider travel plans you have or are making. . If you are planning any travel outside or inside the [place], visit the CDC's Travelers' Health webpage for the latest health notices. . If you have some symptoms but not all symptoms, continue to monitor at home and seek medical attention if your symptoms worsen. . If you are having a medical emergency, call 911.   ADDITIONAL HEALTHCARE OPTIONS FOR PATIENTS  [facility] / e-Visit: [email or web address]         [facility]: [phone]     Urgent Care: [phone]                   [facility]: [phone]   

## 2019-09-05 NOTE — Progress Notes (Signed)
Radiation Oncology         [phone] ________________________________  Name: Kristen Mccoy MRN: [id number]  Date: 09/05/2019  DOB: [birth year]  Follow-Up Visit Note  CC: Monico Blitz, MD  Isabel Caprice, MD    ICD-10-CM   1. Endometrial cancer (HCC)  C54.1     Diagnosis: Stage II, grade 3 endometrial cancer with isolated tumor cells on sentinel node [pT2,pN0(i+)]  Interval Since Last Radiation: One year, two months, and six days.  Radiation treatment dates:   05/06/2018-06/10/2018;  HDR: 06/15/2018, 06/22/2018, and 06/29/2018  Site/dose:  1. Pelvis / 1.8 Gy x 25 fractions for a total dose of 45 Gy 2. Vaginal cuff / 6 Gy x 3 fractions for a total dose of 18 Gy  Narrative: The patient returns today for routine follow-up. She is doing well overall. Since last visit, she had a CT scan of chest/abdomen/pelvis done on 03/23/2019. It showed stable mediastinal and hilar lymph nodes without new or progressive findings. It did not show any pulmonary nodules to suggest pulmonary metastatic disease. No pleural effusion or pleural nodules. There were also no findings for abdominal/pelvic adenopathy or metastatic disease.  She followed-up with Dr. Alycia Rossetti, GYN oncology, on 06/01/2019. At that time, she was doing well and had no complaints.  On review of systems, the patient reports no vaginal bleeding or pelvic pain.. Patient denies abdominal bloating.  She reports using her vaginal dilator twice a week  ALLERGIES:  is allergic to aspirin and penicillins.  Meds: Current Outpatient Medications  Medication Sig Dispense Refill  . albuterol (VENTOLIN HFA) 108 (90 Base) MCG/ACT inhaler     . amLODipine (NORVASC) 5 MG tablet Take 5 mg by mouth daily.    Marland Kitchen atorvastatin (LIPITOR) 20 MG tablet Take 20 mg by mouth daily.    Marland Kitchen dicyclomine (BENTYL) 20 MG tablet Take 1 tablet (20 mg total) by mouth every 6 (six) hours as needed for spasms (abdominal cramping). 15 tablet 0  . docusate sodium  (COLACE) 100 MG capsule Take 1 capsule (100 mg total) by mouth 2 (two) times daily. 30 capsule 0  . dorzolamide (TRUSOPT) 2 % ophthalmic solution     . hydrochlorothiazide (HYDRODIURIL) 25 MG tablet Take 25 mg by mouth daily.    . hydroxypropyl methylcellulose / hypromellose (ISOPTO TEARS / GONIOVISC) 2.5 % ophthalmic solution Place 1 drop into both eyes at bedtime.    . metFORMIN (GLUCOPHAGE) 500 MG tablet Take 500 mg by mouth 2 (two) times daily with a meal.     . ONETOUCH DELICA LANCETS 99991111 MISC     . ONETOUCH VERIO test strip     . sitaGLIPtin (JANUVIA) 50 MG tablet Take 50 mg by mouth daily.    . Vitamin D, Ergocalciferol, (DRISDOL) 50000 UNITS CAPS capsule Take 50,000 Units by mouth every 7 (seven) days. Takes on Thursdays.    . Riboflavin (VITAMIN B-2 PO) Take 125 mg by mouth daily.     No current facility-administered medications for this encounter.    Physical Findings: The patient is in no acute distress. Patient is alert and oriented.  height is 5\' 1"  (1.549 m) and weight is 120 lb 8 oz (54.7 kg). Her temporal temperature is 98.7 F (37.1 C). Her blood pressure is 156/73 (abnormal) and her pulse is 108 (abnormal). Her respiration is 18 and oxygen saturation is 98%.   Lungs are clear to auscultation bilaterally. Heart has regular rate and rhythm. No palpable cervical, supraclavicular, or axillary adenopathy. Abdomen soft,  non-tender, normal bowel sounds.  On pelvic examination the external genitalia were unremarkable. A speculum exam was performed. There are no mucosal lesions noted in the vaginal vault. On bimanual examination there were no pelvic masses appreciated.  Lab Findings: Lab Results  Component Value Date   WBC 10.0 03/12/2018   HGB 10.4 (L) 03/12/2018   HCT 33.0 (L) 03/12/2018   MCV 79.1 03/12/2018   PLT 244 03/12/2018    Radiographic Findings: No results found.  Impression:  Stage II, grade 3 endometrial cancer with isolated tumor cells on sentinel node  [pT2,pN0(i+)].   No evidence of recurrence on clinical exam today.  Plan:  Patient will follow up with gynecologic oncology in 3 months. She will also have CT scans at that time. Follow up in radiation oncology in 6 months.   ____________________________________  Blair Promise, PhD, MD  This document serves as a record of services personally performed by Gery Pray, MD. It was created on his behalf by Clerance Lav, a trained medical scribe. The creation of this record is based on the scribe's personal observations and the provider's statements to them. This document has been checked and approved by the attending provider.

## 2019-10-13 DIAGNOSIS — E119 Type 2 diabetes mellitus without complications: Secondary | ICD-10-CM | POA: Diagnosis not present

## 2019-10-13 DIAGNOSIS — I10 Essential (primary) hypertension: Secondary | ICD-10-CM | POA: Diagnosis not present

## 2019-11-21 DIAGNOSIS — I10 Essential (primary) hypertension: Secondary | ICD-10-CM | POA: Diagnosis not present

## 2019-11-21 DIAGNOSIS — Z789 Other specified health status: Secondary | ICD-10-CM | POA: Diagnosis not present

## 2019-11-21 DIAGNOSIS — Z6824 Body mass index (BMI) 24.0-24.9, adult: Secondary | ICD-10-CM | POA: Diagnosis not present

## 2019-11-21 DIAGNOSIS — E1165 Type 2 diabetes mellitus with hyperglycemia: Secondary | ICD-10-CM | POA: Diagnosis not present

## 2019-11-21 DIAGNOSIS — Z299 Encounter for prophylactic measures, unspecified: Secondary | ICD-10-CM | POA: Diagnosis not present

## 2019-11-21 DIAGNOSIS — E1142 Type 2 diabetes mellitus with diabetic polyneuropathy: Secondary | ICD-10-CM | POA: Diagnosis not present

## 2019-11-27 NOTE — Progress Notes (Deleted)
Gynecologic Oncology Return Clinic Visit  @DATE @  Reason for Visit: ***  Treatment History: Oncology History Overview Note  MSI normal   Endometrial cancer ([place])  02/15/2018 Imaging   CT AP revealed endometrial mass measuring approximately 4.2 x 4.2 cm most concerning for endometrial carcinoma.   02/24/2018 Initial Biopsy   EMB - poorly differentiated adenocarcinoma Pap - adenocarcinoma   02/24/2018 Initial Diagnosis   Endometrial cancer ([place])   03/11/2018 Surgery   TRH/BSO, SLN bx, washings, pelvic LND, [place] LND Dr. Gerarda Fraction  FIGO stage 2 Gr3 endometrioid adenocarcinoma, + sentinel ITC   05/06/2018 - 06/29/2018 Radiation Therapy   8/15-9/19: EBRT (pelvis), 1.8Gy x25 fxns for total of 45Gy 9/24, 10/1, and 10/8: HDR brachy, 6 Gy x3 fxns for total of 18 Gy   09/2018 Imaging   CT - NED but there is a mediastinal node with recommendations to follow in 6 months.    03/2019 Imaging   CT scan IMPRESSION: 1. Stable mediastinal and hilar lymph nodes. No new or progressive findings. 2. No pulmonary nodules to suggest pulmonary metastatic disease. No pleural effusion or pleural nodules. 3. Stable bilateral lower lobe bronchiectasis. 4. No CT findings for abdominal/pelvic adenopathy or metastatic disease.     Interval History: ***  Past Medical/Surgical History: Past Medical History:  Diagnosis Date  . Arthritis   . Diabetes ([place])     type 2  . Endometrial cancer ([place]) 02/24/2018  . Hypertension    x 5 yr  . Pneumonia     Past Surgical History:  Procedure Laterality Date  . ABDOMINAL HYSTERECTOMY     total hysterectomy Dr. Gerarda Fraction 03-11-18  . APPENDECTOMY     Open HOWEVER incision is in RUQ  . COLONOSCOPY N/A 12/14/2013   Procedure: COLONOSCOPY;  Surgeon: Rogene Houston, MD;  [facility];  Service: Endoscopy;  Laterality: N/A;  100  . ESOPHAGOGASTRODUODENOSCOPY N/A 12/26/2015   Procedure: ESOPHAGOGASTRODUODENOSCOPY (EGD);  Surgeon: Rogene Houston, MD;  Location: [facility];  Service: Endoscopy;  Laterality: N/A;  2:45  . ROBOTIC ASSISTED TOTAL HYSTERECTOMY WITH BILATERAL SALPINGO OOPHERECTOMY N/A 03/11/2018   Procedure: XI ROBOTIC ASSISTED TOTAL HYSTERECTOMY WITH BILATERAL SALPINGO OOPHORECTOMY, WITH STAGING;  Surgeon: Isabel Caprice, MD;  Location: [facility];  Service: Gynecology;  Laterality: N/A;  . SENTINEL NODE BIOPSY N/A 03/11/2018   Procedure: SENTINEL NODE BIOPSY;  Surgeon: Isabel Caprice, MD;  Location: [facility];  Service: Gynecology;  Laterality: N/A;  . TUBAL LIGATION      Family History  Problem Relation Age of Onset  . Diabetes Mother   . Diabetes Brother   . Colon cancer Neg Hx     Social History   Socioeconomic History  . Marital status: Widowed    Spouse name: Not on file  . Number of children: Not on file  . Years of education: Not on file  . Highest education level: Not on file  Occupational History  . Not on file  Tobacco Use  . Smoking status: Never Smoker  . Smokeless tobacco: Never Used  Substance and Sexual Activity  . Alcohol use: No  . Drug use: No  . Sexual activity: Yes  Other Topics Concern  . Not on file  Social History Narrative  . Not on file   Social Determinants of Health   Financial Resource Strain:   . Difficulty of Paying Living Expenses: Not on file  Food Insecurity:   . Worried About Charity fundraiser in the  Last Year: Not on file  . Ran Out of Food in the Last Year: Not on file  Transportation Needs:   . Lack of Transportation (Medical): Not on file  . Lack of Transportation (Non-Medical): Not on file  Physical Activity:   . Days of Exercise per Week: Not on file  . Minutes of Exercise per Session: Not on file  Stress:   . Feeling of Stress : Not on file  Social Connections:   . Frequency of Communication with Friends and Family: Not on file  . Frequency of Social Gatherings with Friends and Family: Not on file  . Attends Religious Services: Not on file  . Active Member of Clubs or  Organizations: Not on file  . Attends Archivist Meetings: Not on file  . Marital Status: Not on file    Current Medications:  Current Outpatient Medications:  .  albuterol (VENTOLIN HFA) 108 (90 Base) MCG/ACT inhaler, , Disp: , Rfl:  .  amLODipine (NORVASC) 5 MG tablet, Take 5 mg by mouth daily., Disp: , Rfl:  .  atorvastatin (LIPITOR) 20 MG tablet, Take 20 mg by mouth daily., Disp: , Rfl:  .  dicyclomine (BENTYL) 20 MG tablet, Take 1 tablet (20 mg total) by mouth every 6 (six) hours as needed for spasms (abdominal cramping)., Disp: 15 tablet, Rfl: 0 .  docusate sodium (COLACE) 100 MG capsule, Take 1 capsule (100 mg total) by mouth 2 (two) times daily., Disp: 30 capsule, Rfl: 0 .  dorzolamide (TRUSOPT) 2 % ophthalmic solution, , Disp: , Rfl:  .  hydrochlorothiazide (HYDRODIURIL) 25 MG tablet, Take 25 mg by mouth daily., Disp: , Rfl:  .  hydroxypropyl methylcellulose / hypromellose (ISOPTO TEARS / GONIOVISC) 2.5 % ophthalmic solution, Place 1 drop into both eyes at bedtime., Disp: , Rfl:  .  metFORMIN (GLUCOPHAGE) 500 MG tablet, Take 500 mg by mouth 2 (two) times daily with a meal. , Disp: , Rfl:  .  ONETOUCH DELICA LANCETS 25K MISC, , Disp: , Rfl:  .  ONETOUCH VERIO test strip, , Disp: , Rfl:  .  Riboflavin (VITAMIN B-2 PO), Take 125 mg by mouth daily., Disp: , Rfl:  .  sitaGLIPtin (JANUVIA) 50 MG tablet, Take 50 mg by mouth daily., Disp: , Rfl:  .  Vitamin D, Ergocalciferol, (DRISDOL) 50000 UNITS CAPS capsule, Take 50,000 Units by mouth every 7 (seven) days. Takes on Thursdays., Disp: , Rfl:   Review of Systems: Denies appetite changes, fevers, chills, fatigue, unexplained weight changes. Denies hearing loss, neck lumps or masses, mouth sores, ringing in ears or voice changes. Denies cough or wheezing.  Denies shortness of breath. Denies chest pain or palpitations. Denies leg swelling. Denies abdominal distention, pain, blood in stools, constipation, diarrhea, nausea,  vomiting, or early satiety. Denies pain with intercourse, dysuria, frequency, hematuria or incontinence. Denies hot flashes, pelvic pain, vaginal bleeding or vaginal discharge.   Denies joint pain, back pain or muscle pain/cramps. Denies itching, rash, or wounds. Denies dizziness, headaches, numbness or seizures. Denies swollen lymph nodes or glands, denies easy bruising or bleeding. Denies anxiety, depression, confusion, or decreased concentration.  Physical Exam: There were no vitals taken for this visit. General: ***Alert, oriented, no acute distress. HEENT: ***Posterior oropharynx clear, sclera anicteric. Chest: ***Clear to auscultation bilaterally.  ***Port site clean. Cardiovascular: ***Regular rate and rhythm, no murmurs. Abdomen: ***Obese, soft, nontender.  Normoactive bowel sounds.  No masses or hepatosplenomegaly appreciated.  ***Well-healed scar. Extremities: ***Grossly normal range of motion.  Warm,  well perfused.  No edema bilaterally. Skin: ***No rashes or lesions noted. Lymphatics: ***No cervical, supraclavicular, or inguinal adenopathy. GU: Normal appearing external genitalia without erythema, excoriation, or lesions.  Speculum exam reveals ***.  Bimanual exam reveals ***.  ***Rectovaginal exam  confirms ___.  Laboratory & Radiologic Studies: ***  Assessment & Plan: Kristen Mccoy is a 79 y.o. woman with Stage *** who presents for ***.  ***  *** minutes of total time was spent for this patient encounter, including preparation, face-to-face counseling with the patient and coordination of care, and documentation of the encounter.  Jeral Pinch, MD  Division of Gynecologic Oncology  Department of Obstetrics and Gynecology  [facility]

## 2019-11-28 ENCOUNTER — Telehealth: Payer: Self-pay | Admitting: Radiation Oncology

## 2019-11-28 DIAGNOSIS — Z0000 Encounter for general adult medical examination without abnormal findings: Secondary | ICD-10-CM | POA: Diagnosis not present

## 2019-11-28 DIAGNOSIS — Z7189 Other specified counseling: Secondary | ICD-10-CM | POA: Diagnosis not present

## 2019-11-28 DIAGNOSIS — E559 Vitamin D deficiency, unspecified: Secondary | ICD-10-CM | POA: Diagnosis not present

## 2019-11-28 DIAGNOSIS — Z6824 Body mass index (BMI) 24.0-24.9, adult: Secondary | ICD-10-CM | POA: Diagnosis not present

## 2019-11-28 DIAGNOSIS — Z1211 Encounter for screening for malignant neoplasm of colon: Secondary | ICD-10-CM | POA: Diagnosis not present

## 2019-11-28 DIAGNOSIS — E7800 Pure hypercholesterolemia, unspecified: Secondary | ICD-10-CM | POA: Diagnosis not present

## 2019-11-28 DIAGNOSIS — E1165 Type 2 diabetes mellitus with hyperglycemia: Secondary | ICD-10-CM | POA: Diagnosis not present

## 2019-11-28 DIAGNOSIS — R5383 Other fatigue: Secondary | ICD-10-CM | POA: Diagnosis not present

## 2019-11-28 DIAGNOSIS — Z299 Encounter for prophylactic measures, unspecified: Secondary | ICD-10-CM | POA: Diagnosis not present

## 2019-11-28 DIAGNOSIS — Z79899 Other long term (current) drug therapy: Secondary | ICD-10-CM | POA: Diagnosis not present

## 2019-11-28 NOTE — Telephone Encounter (Signed)
Tiffany called in to reschedule patients lab appt for 3/9. Moved appt to 3/17. [facility] Radiology number to reschedule CT

## 2019-11-29 ENCOUNTER — Ambulatory Visit (HOSPITAL_COMMUNITY): Admission: RE | Admit: 2019-11-29 | Payer: Medicare HMO | Source: Ambulatory Visit

## 2019-11-29 ENCOUNTER — Other Ambulatory Visit: Payer: 59

## 2019-11-30 ENCOUNTER — Inpatient Hospital Stay: Payer: Medicare Other | Attending: Gynecologic Oncology | Admitting: Gynecologic Oncology

## 2019-11-30 DIAGNOSIS — Z8542 Personal history of malignant neoplasm of other parts of uterus: Secondary | ICD-10-CM | POA: Insufficient documentation

## 2019-11-30 DIAGNOSIS — Z7984 Long term (current) use of oral hypoglycemic drugs: Secondary | ICD-10-CM | POA: Insufficient documentation

## 2019-11-30 DIAGNOSIS — Z923 Personal history of irradiation: Secondary | ICD-10-CM | POA: Insufficient documentation

## 2019-11-30 DIAGNOSIS — E119 Type 2 diabetes mellitus without complications: Secondary | ICD-10-CM | POA: Insufficient documentation

## 2019-11-30 DIAGNOSIS — Z90710 Acquired absence of both cervix and uterus: Secondary | ICD-10-CM | POA: Insufficient documentation

## 2019-11-30 DIAGNOSIS — Z833 Family history of diabetes mellitus: Secondary | ICD-10-CM | POA: Insufficient documentation

## 2019-11-30 DIAGNOSIS — Z90722 Acquired absence of ovaries, bilateral: Secondary | ICD-10-CM | POA: Insufficient documentation

## 2019-11-30 DIAGNOSIS — Z79899 Other long term (current) drug therapy: Secondary | ICD-10-CM | POA: Insufficient documentation

## 2019-11-30 DIAGNOSIS — Z9079 Acquired absence of other genital organ(s): Secondary | ICD-10-CM | POA: Insufficient documentation

## 2019-11-30 DIAGNOSIS — I10 Essential (primary) hypertension: Secondary | ICD-10-CM | POA: Insufficient documentation

## 2019-12-01 ENCOUNTER — Telehealth: Payer: Self-pay | Admitting: *Deleted

## 2019-12-01 NOTE — Telephone Encounter (Signed)
Called the patient and rescheduled the missed appt from yesterday

## 2019-12-05 NOTE — Progress Notes (Signed)
Gynecologic Oncology Return Clinic Visit  12/09/19  Reason for Visit: surveillance visit in the setting of high risk endometrial cancer  Treatment History: Oncology History Overview Note  MSI normal   Endometrial cancer ([place])  02/15/2018 Imaging   CT AP revealed endometrial mass measuring approximately 4.2 x 4.2 cm most concerning for endometrial carcinoma.   02/24/2018 Initial Biopsy   EMB - poorly differentiated adenocarcinoma Pap - adenocarcinoma   02/24/2018 Initial Diagnosis   Endometrial cancer ([place])   03/11/2018 Surgery   TRH/BSO, SLN bx, washings, pelvic LND, [place] LND Dr. Gerarda Fraction  FIGO stage 2 Gr3 endometrioid adenocarcinoma, + sentinel ITC   05/06/2018 - 06/29/2018 Radiation Therapy   8/15-9/19: EBRT (pelvis), 1.8Gy x25 fxns for total of 45Gy 9/24, 10/1, and 10/8: HDR brachy, 6 Gy x3 fxns for total of 18 Gy   09/2018 Imaging   CT - NED but there is a mediastinal node with recommendations to follow in 6 months.    03/2019 Imaging   CT scan IMPRESSION: 1. Stable mediastinal and hilar lymph nodes. No new or progressive findings. 2. No pulmonary nodules to suggest pulmonary metastatic disease. No pleural effusion or pleural nodules. 3. Stable bilateral lower lobe bronchiectasis. 4. No CT findings for abdominal/pelvic adenopathy or metastatic disease.   11/30/2019 Cancer Staging   Staging form: Corpus Uteri - Carcinoma and Carcinosarcoma, AJCC 8th Edition - Clinical stage from 11/30/2019: FIGO Stage II (cT2, cN0(i+)(sn), cM0) - Signed by Lafonda Mosses, MD on 11/30/2019     Interval History: The patient reports overall doing well since her last visit.  She denies any vaginal bleeding or discharge.  She denies any pelvic or abdominal pain.  She reports normal bowel function.  For the last week, she endorses some mild dysuria which happens sometimes after she drinks soda.  Last saw Dr. Sondra Come in 08/2019. She is now almost 18 months out from completion of adjuvant therapy. CT  C/A/P in 03/2019 showed stable mediastinal and hilar lymph nodes without new or progressive findings.   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 She is hoping to get the COVID vaccine in the next week.  Past Medical/Surgical History: Past Medical History:  Diagnosis Date  . Arthritis   . Diabetes ([place])     type 2  . Endometrial cancer ([place]) 02/24/2018  . Hypertension    x 5 yr  . Pneumonia     Past Surgical History:  Procedure Laterality Date  . ABDOMINAL HYSTERECTOMY     total hysterectomy Dr. Gerarda Fraction 03-11-18  . APPENDECTOMY     Open HOWEVER incision is in RUQ  . COLONOSCOPY N/A 12/14/2013   Procedure: COLONOSCOPY;  Surgeon: Rogene Houston, MD;  [facility];  Service: Endoscopy;  Laterality: N/A;  100  . ESOPHAGOGASTRODUODENOSCOPY N/A 12/26/2015   Procedure: ESOPHAGOGASTRODUODENOSCOPY (EGD);  Surgeon: Rogene Houston, MD;  [facility];  Service: Endoscopy;  Laterality: N/A;  2:45  . ROBOTIC ASSISTED TOTAL HYSTERECTOMY WITH BILATERAL SALPINGO OOPHERECTOMY N/A 03/11/2018   Procedure: XI ROBOTIC ASSISTED TOTAL HYSTERECTOMY WITH BILATERAL SALPINGO OOPHORECTOMY, WITH STAGING;  Surgeon: Isabel Caprice, MD;  [facility];  Service: Gynecology;  Laterality: N/A;  . SENTINEL NODE BIOPSY N/A 03/11/2018   Procedure: SENTINEL NODE BIOPSY;  Surgeon: Isabel Caprice, MD;  [facility];  Service: Gynecology;  Laterality: N/A;  . TUBAL LIGATION      Family History  Problem Relation Age of Onset  .  Diabetes Mother   . Diabetes Brother   . Colon cancer Neg Hx     Social History   Socioeconomic History  . Marital status: Widowed    Spouse name: Not on file  . Number of children: Not on file  . Years of education: Not on file  . Highest education level: Not on file  Occupational History  . Not on file  Tobacco Use  . Smoking status: Never Smoker  . Smokeless tobacco: Never Used  Substance and Sexual Activity  . Alcohol  use: No  . Drug use: No  . Sexual activity: Yes  Other Topics Concern  . Not on file  Social History Narrative  . Not on file   Social Determinants of Health   Financial Resource Strain:   . Difficulty of Paying Living Expenses:   Food Insecurity:   . Worried About Charity fundraiser in the Last Year:   . Arboriculturist in the Last Year:   Transportation Needs:   . Film/video editor (Medical):   Marland Kitchen Lack of Transportation (Non-Medical):   Physical Activity:   . Days of Exercise per Week:   . Minutes of Exercise per Session:   Stress:   . Feeling of Stress :   Social Connections:   . Frequency of Communication with Friends and Family:   . Frequency of Social Gatherings with Friends and Family:   . Attends Religious Services:   . Active Member of Clubs or Organizations:   . Attends Archivist Meetings:   Marland Kitchen Marital Status:     Current Medications:  Current Outpatient Medications:  .  albuterol (VENTOLIN HFA) 108 (90 Base) MCG/ACT inhaler, , Disp: , Rfl:  .  amLODipine (NORVASC) 5 MG tablet, Take 5 mg by mouth daily., Disp: , Rfl:  .  atorvastatin (LIPITOR) 20 MG tablet, Take 20 mg by mouth daily., Disp: , Rfl:  .  dicyclomine (BENTYL) 20 MG tablet, Take 1 tablet (20 mg total) by mouth every 6 (six) hours as needed for spasms (abdominal cramping)., Disp: 15 tablet, Rfl: 0 .  docusate sodium (COLACE) 100 MG capsule, Take 1 capsule (100 mg total) by mouth 2 (two) times daily., Disp: 30 capsule, Rfl: 0 .  dorzolamide (TRUSOPT) 2 % ophthalmic solution, , Disp: , Rfl:  .  hydrochlorothiazide (HYDRODIURIL) 25 MG tablet, Take 25 mg by mouth daily., Disp: , Rfl:  .  hydroxypropyl methylcellulose / hypromellose (ISOPTO TEARS / GONIOVISC) 2.5 % ophthalmic solution, Place 1 drop into both eyes at bedtime., Disp: , Rfl:  .  metFORMIN (GLUCOPHAGE) 500 MG tablet, Take 500 mg by mouth 2 (two) times daily with a meal. , Disp: , Rfl:  .  ONETOUCH DELICA LANCETS 00X MISC, , Disp:  , Rfl:  .  ONETOUCH VERIO test strip, , Disp: , Rfl:  .  Riboflavin (VITAMIN B-2 PO), Take 125 mg by mouth daily., Disp: , Rfl:  .  sitaGLIPtin (JANUVIA) 50 MG tablet, Take 50 mg by mouth daily., Disp: , Rfl:  .  Vitamin D, Ergocalciferol, (DRISDOL) 50000 UNITS CAPS capsule, Take 50,000 Units by mouth every 7 (seven) days. Takes on Thursdays., Disp: , Rfl:  No current facility-administered medications for this visit.  Facility-Administered Medications Ordered in Other Visits:  .  sodium chloride (PF) 0.9 % injection, , , ,   Review of Systems: Pertinent positives as per HPI. Denies appetite changes, fevers, chills, fatigue, unexplained weight changes. Denies hearing loss, neck lumps or  masses, mouth sores, ringing in ears or voice changes. Denies cough or wheezing.  Denies shortness of breath. Denies chest pain or palpitations. Denies leg swelling. Denies abdominal distention, pain, blood in stools, constipation, diarrhea, nausea, vomiting, or early satiety. Denies pain with intercourse, frequency, hematuria or incontinence. Denies hot flashes, pelvic pain, vaginal bleeding or vaginal discharge.   Denies joint pain, back pain or muscle pain/cramps. Denies itching, rash, or wounds. Denies dizziness, headaches, numbness or seizures. Denies swollen lymph nodes or glands, denies easy bruising or bleeding. Denies anxiety, depression, confusion, or decreased concentration.  Physical Exam: BP (!) 142/59 (BP Location: Left Arm, Patient Position: Sitting)   Pulse (!) 113   Temp 98 F (36.7 C) (Temporal)   Ht 5' 1"  (1.549 m)   Wt 118 lb 6 oz (53.7 kg)   SpO2 99%   BMI 22.37 kg/m  General: Alert, oriented, no acute distress. HEENT: Normocephalic, atraumatic, sclera anicteric. Chest: Unlabored breathing on room air. Abdomen: soft, nontender.  Normoactive bowel sounds.  No masses or hepatosplenomegaly appreciated.   Extremities: Grossly normal range of motion.  Warm, well perfused.  No  edema bilaterally. Skin: No rashes or lesions noted. Lymphatics: No cervical, supraclavicular, or inguinal adenopathy. GU: Normal appearing external genitalia without erythema, excoriation, or lesions.  Speculum exam reveals atrophic mucosa with radiation changes noted.  No lesions or masses, cuff intact.  Bimanual exam reveals cuff intact, no masses.  Rectovaginal exam confirms these findings, no nodularity.  Laboratory & Radiologic Studies: CT chest 12/09/19: Read pending  Assessment & Plan: Kristen Mccoy is a 79 y.o. 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  The patient is overall doing well and NED on exam today.  On my review of her chest CT, I do not see any progression or new lesions.  I will call her with the results once radiologist has read the scan.  Per [facility] surveillance recommendations, we will continue q 3 month visits (alternating between our office and radiation oncology) until she is 2 years out from treatment at which time we will move to q 6 month visits.  I will plan to see her back in 6 months and she will see Dr. Sondra Come in 3 months.  Given her urinary symptoms, discussed increasing water intake and will send UA today.  Will prescribe antibiotics if indicated to treat UTI.  20 minutes of total time was spent for this patient encounter, including preparation, face-to-face counseling with the patient and coordination of care, and documentation of the encounter.  Jeral Pinch, MD  Division of Gynecologic Oncology  Department of Obstetrics and Gynecology  [facility]

## 2019-12-07 ENCOUNTER — Other Ambulatory Visit: Payer: 59

## 2019-12-09 ENCOUNTER — Inpatient Hospital Stay: Payer: Medicare Other

## 2019-12-09 ENCOUNTER — Encounter: Payer: Self-pay | Admitting: Gynecologic Oncology

## 2019-12-09 ENCOUNTER — Inpatient Hospital Stay (HOSPITAL_BASED_OUTPATIENT_CLINIC_OR_DEPARTMENT_OTHER): Payer: Medicare Other | Admitting: Gynecologic Oncology

## 2019-12-09 ENCOUNTER — Encounter (HOSPITAL_COMMUNITY): Payer: Self-pay

## 2019-12-09 ENCOUNTER — Ambulatory Visit (HOSPITAL_COMMUNITY)
Admission: RE | Admit: 2019-12-09 | Discharge: 2019-12-09 | Disposition: A | Discharge status: Home / Self Care | Payer: Medicare Other | Source: Ambulatory Visit | Attending: Gynecologic Oncology | Admitting: Gynecologic Oncology

## 2019-12-09 ENCOUNTER — Other Ambulatory Visit: Payer: Self-pay

## 2019-12-09 ENCOUNTER — Telehealth: Payer: Self-pay | Admitting: Gynecologic Oncology

## 2019-12-09 VITALS — BP 142/59 | HR 113 | Temp 98.0°F | Ht 61.0 in | Wt 118.4 lb

## 2019-12-09 DIAGNOSIS — Z90710 Acquired absence of both cervix and uterus: Secondary | ICD-10-CM | POA: Diagnosis not present

## 2019-12-09 DIAGNOSIS — Z923 Personal history of irradiation: Secondary | ICD-10-CM

## 2019-12-09 DIAGNOSIS — Z9079 Acquired absence of other genital organ(s): Secondary | ICD-10-CM

## 2019-12-09 DIAGNOSIS — R599 Enlarged lymph nodes, unspecified: Secondary | ICD-10-CM | POA: Diagnosis not present

## 2019-12-09 DIAGNOSIS — E119 Type 2 diabetes mellitus without complications: Secondary | ICD-10-CM | POA: Diagnosis not present

## 2019-12-09 DIAGNOSIS — R300 Dysuria: Secondary | ICD-10-CM | POA: Insufficient documentation

## 2019-12-09 DIAGNOSIS — C541 Malignant neoplasm of endometrium: Secondary | ICD-10-CM

## 2019-12-09 DIAGNOSIS — Z8542 Personal history of malignant neoplasm of other parts of uterus: Secondary | ICD-10-CM

## 2019-12-09 DIAGNOSIS — R590 Localized enlarged lymph nodes: Secondary | ICD-10-CM | POA: Insufficient documentation

## 2019-12-09 DIAGNOSIS — I10 Essential (primary) hypertension: Secondary | ICD-10-CM | POA: Diagnosis not present

## 2019-12-09 DIAGNOSIS — Z79899 Other long term (current) drug therapy: Secondary | ICD-10-CM | POA: Diagnosis not present

## 2019-12-09 DIAGNOSIS — Z90722 Acquired absence of ovaries, bilateral: Secondary | ICD-10-CM | POA: Diagnosis not present

## 2019-12-09 DIAGNOSIS — I313 Pericardial effusion (noninflammatory): Secondary | ICD-10-CM | POA: Diagnosis not present

## 2019-12-09 DIAGNOSIS — Z7984 Long term (current) use of oral hypoglycemic drugs: Secondary | ICD-10-CM | POA: Diagnosis not present

## 2019-12-09 DIAGNOSIS — Z833 Family history of diabetes mellitus: Secondary | ICD-10-CM | POA: Diagnosis not present

## 2019-12-09 LAB — URINALYSIS, COMPLETE (UACMP) WITH MICROSCOPIC
Bacteria, UA: NONE SEEN
Bilirubin Urine: NEGATIVE
Glucose, UA: NEGATIVE mg/dL
Hgb urine dipstick: NEGATIVE
Ketones, ur: NEGATIVE mg/dL
Leukocytes,Ua: NEGATIVE
Nitrite: NEGATIVE
Protein, ur: NEGATIVE mg/dL
Specific Gravity, Urine: 1.036 — ABNORMAL HIGH (ref 1.005–1.030)
pH: 6 (ref 5.0–8.0)

## 2019-12-09 LAB — BASIC METABOLIC PANEL
Anion gap: 8 (ref 5–15)
BUN: 15 mg/dL (ref 8–23)
CO2: 26 mmol/L (ref 22–32)
Calcium: 9.1 mg/dL (ref 8.9–10.3)
Chloride: 106 mmol/L (ref 98–111)
Creatinine, Ser: 0.72 mg/dL (ref 0.44–1.00)
GFR calc Af Amer: >60 mL/min (ref >60)
GFR calc non Af Amer: >60 mL/min (ref >60)
Glucose, Bld: 116 mg/dL — ABNORMAL HIGH (ref 70–99)
Potassium: 4.5 mmol/L (ref 3.5–5.1)
Sodium: 140 mmol/L (ref 135–145)

## 2019-12-09 MED ORDER — SODIUM CHLORIDE (PF) 0.9 % IJ SOLN
INTRAMUSCULAR | Status: AC
  Filled 2019-12-09: qty 50

## 2019-12-09 MED ORDER — IOHEXOL 300 MG/ML  SOLN
75.0000 mL | Freq: Once | INTRAMUSCULAR | Status: AC | PRN
  Administered 2019-12-09: 75 mL via INTRAVENOUS

## 2019-12-09 NOTE — Telephone Encounter (Signed)
Called the patient to discuss CT chest read. Stable appearance of the mediastinal lymph nodes, no areas c/w metastatic disease. Patient happy with news.  Jeral Pinch MD Gynecologic Oncology

## 2019-12-09 NOTE — Patient Instructions (Signed)
You are doing great today and your exam is normal.  I will call you with the final results from your CT scan.  I will see you in September and I would asked that you call in May to schedule that visit since our schedules are not out past July.  If you develop any vaginal bleeding, discharge, or other symptoms prior to your next visit, please call us at [phone].

## 2019-12-10 LAB — URINE CULTURE: Culture: 30000 — AB

## 2019-12-12 DIAGNOSIS — E1165 Type 2 diabetes mellitus with hyperglycemia: Secondary | ICD-10-CM | POA: Diagnosis not present

## 2019-12-12 DIAGNOSIS — Z299 Encounter for prophylactic measures, unspecified: Secondary | ICD-10-CM | POA: Diagnosis not present

## 2019-12-12 DIAGNOSIS — R300 Dysuria: Secondary | ICD-10-CM | POA: Diagnosis not present

## 2019-12-12 DIAGNOSIS — I10 Essential (primary) hypertension: Secondary | ICD-10-CM | POA: Diagnosis not present

## 2019-12-13 ENCOUNTER — Telehealth: Payer: Self-pay

## 2019-12-13 NOTE — Telephone Encounter (Signed)
Spoke with patient to follow up on recent UTI symptoms.  Patient stated that she went to see her PCP yesterday and they started her on a antibiotic.  She says that she does not have any symptoms at this time.  Told patient to call back to office if needed.

## 2019-12-14 DIAGNOSIS — I10 Essential (primary) hypertension: Secondary | ICD-10-CM | POA: Diagnosis not present

## 2019-12-14 DIAGNOSIS — E119 Type 2 diabetes mellitus without complications: Secondary | ICD-10-CM | POA: Diagnosis not present

## 2019-12-30 DIAGNOSIS — E119 Type 2 diabetes mellitus without complications: Secondary | ICD-10-CM | POA: Diagnosis not present

## 2020-01-16 DIAGNOSIS — E119 Type 2 diabetes mellitus without complications: Secondary | ICD-10-CM | POA: Diagnosis not present

## 2020-01-16 DIAGNOSIS — I10 Essential (primary) hypertension: Secondary | ICD-10-CM | POA: Diagnosis not present

## 2020-02-03 DIAGNOSIS — N390 Urinary tract infection, site not specified: Secondary | ICD-10-CM | POA: Diagnosis not present

## 2020-02-03 DIAGNOSIS — I10 Essential (primary) hypertension: Secondary | ICD-10-CM | POA: Diagnosis not present

## 2020-02-03 DIAGNOSIS — Z299 Encounter for prophylactic measures, unspecified: Secondary | ICD-10-CM | POA: Diagnosis not present

## 2020-02-03 DIAGNOSIS — R350 Frequency of micturition: Secondary | ICD-10-CM | POA: Diagnosis not present

## 2020-02-03 DIAGNOSIS — E1165 Type 2 diabetes mellitus with hyperglycemia: Secondary | ICD-10-CM | POA: Diagnosis not present

## 2020-02-03 DIAGNOSIS — E1142 Type 2 diabetes mellitus with diabetic polyneuropathy: Secondary | ICD-10-CM | POA: Diagnosis not present

## 2020-02-19 DIAGNOSIS — I10 Essential (primary) hypertension: Secondary | ICD-10-CM | POA: Diagnosis not present

## 2020-02-19 DIAGNOSIS — E119 Type 2 diabetes mellitus without complications: Secondary | ICD-10-CM | POA: Diagnosis not present

## 2020-02-23 DIAGNOSIS — Z299 Encounter for prophylactic measures, unspecified: Secondary | ICD-10-CM | POA: Diagnosis not present

## 2020-02-23 DIAGNOSIS — E7800 Pure hypercholesterolemia, unspecified: Secondary | ICD-10-CM | POA: Diagnosis not present

## 2020-02-23 DIAGNOSIS — M549 Dorsalgia, unspecified: Secondary | ICD-10-CM | POA: Diagnosis not present

## 2020-02-23 DIAGNOSIS — E1165 Type 2 diabetes mellitus with hyperglycemia: Secondary | ICD-10-CM | POA: Diagnosis not present

## 2020-02-23 DIAGNOSIS — I10 Essential (primary) hypertension: Secondary | ICD-10-CM | POA: Diagnosis not present

## 2020-03-05 ENCOUNTER — Encounter: Payer: Self-pay | Admitting: Radiation Oncology

## 2020-03-05 ENCOUNTER — Other Ambulatory Visit: Payer: Self-pay

## 2020-03-05 ENCOUNTER — Ambulatory Visit
Admission: RE | Admit: 2020-03-05 | Discharge: 2020-03-05 | Disposition: A | Discharge status: Home / Self Care | Payer: Medicare Other | Source: Ambulatory Visit | Attending: Radiation Oncology | Admitting: Radiation Oncology

## 2020-03-05 DIAGNOSIS — Z08 Encounter for follow-up examination after completed treatment for malignant neoplasm: Secondary | ICD-10-CM | POA: Diagnosis not present

## 2020-03-05 DIAGNOSIS — Z8542 Personal history of malignant neoplasm of other parts of uterus: Secondary | ICD-10-CM | POA: Diagnosis not present

## 2020-03-05 DIAGNOSIS — Z923 Personal history of irradiation: Secondary | ICD-10-CM | POA: Diagnosis not present

## 2020-03-05 DIAGNOSIS — Z7984 Long term (current) use of oral hypoglycemic drugs: Secondary | ICD-10-CM | POA: Diagnosis not present

## 2020-03-05 DIAGNOSIS — Z79899 Other long term (current) drug therapy: Secondary | ICD-10-CM | POA: Insufficient documentation

## 2020-03-05 DIAGNOSIS — I313 Pericardial effusion (noninflammatory): Secondary | ICD-10-CM | POA: Diagnosis not present

## 2020-03-05 DIAGNOSIS — C541 Malignant neoplasm of endometrium: Secondary | ICD-10-CM

## 2020-03-05 NOTE — Progress Notes (Signed)
Patient here for a 6 month f/u visit with Dr. Sondra Come, Patient denies pain, bleeding, vaginal discharge, or bladder problems.

## 2020-03-05 NOTE — Progress Notes (Addendum)
Radiation Oncology         [phone] ________________________________  Name: Kristen Mccoy MRN: [id number]  Date: 03/05/2020  DOB: 27-Jun-1941  Follow-Up Visit Note  CC: Monico Blitz, MD  Isabel Caprice, MD    ICD-10-CM   1. Endometrial cancer ([facility])  C54.1     Diagnosis: Stage II, grade 3 endometrial cancer with isolated tumor cells on sentinel node [pT2,pN0(i+)]  Interval Since Last Radiation: One year, eight months, and six days.  Radiation treatment dates:  05/06/2018 - 06/10/2018; HDR: 06/15/2018, 06/22/2018, and 06/29/2018  Site/dose:  1. Pelvis / 1.8 Gy x 25 fractions for a total dose of 45 Gy 2. Vaginal cuff / 6 Gy x 3 fractions for a total dose of 18 Gy  Narrative: The patient returns today for routine follow-up. She is doing well overall. Since her last visit, she had a follow-up chest CT on 12/09/2019 that showed unchanged prominent mediastinal lymph nodes without evidence of metastatic disease in the chest. The trace pericardial effusion was unchanged.  She followed-up with Dr. Berline Lopes on 12/09/2019, during which time she was noted to be doing well and remained under surveillance.  On review of systems, she reports no pain, vaginal bleeding/discharge, or urinary symptoms.  She continues to use her vaginal dilator.  She has not noticed any bleeding after using her vaginal dilator.  ALLERGIES:  is allergic to aspirin and penicillins.  Meds: Current Outpatient Medications  Medication Sig Dispense Refill  . albuterol (VENTOLIN HFA) 108 (90 Base) MCG/ACT inhaler     . amLODipine (NORVASC) 5 MG tablet Take 5 mg by mouth daily.    Marland Kitchen atorvastatin (LIPITOR) 20 MG tablet Take 20 mg by mouth daily.    Marland Kitchen dicyclomine (BENTYL) 20 MG tablet Take 1 tablet (20 mg total) by mouth every 6 (six) hours as needed for spasms (abdominal cramping). 15 tablet 0  . docusate sodium (COLACE) 100 MG capsule Take 1 capsule (100 mg total) by mouth 2 (two) times daily. 30 capsule 0  .  dorzolamide (TRUSOPT) 2 % ophthalmic solution     . hydrochlorothiazide (HYDRODIURIL) 25 MG tablet Take 25 mg by mouth daily.    . hydroxypropyl methylcellulose / hypromellose (ISOPTO TEARS / GONIOVISC) 2.5 % ophthalmic solution Place 1 drop into both eyes at bedtime.    . metFORMIN (GLUCOPHAGE) 500 MG tablet Take 500 mg by mouth 2 (two) times daily with a meal.     . ONETOUCH DELICA LANCETS 01S MISC     . ONETOUCH VERIO test strip     . Riboflavin (VITAMIN B-2 PO) Take 125 mg by mouth daily.    . sitaGLIPtin (JANUVIA) 50 MG tablet Take 50 mg by mouth daily.    . Vitamin D, Ergocalciferol, (DRISDOL) 50000 UNITS CAPS capsule Take 50,000 Units by mouth every 7 (seven) days. Takes on Thursdays.     No current facility-administered medications for this encounter.    Physical Findings: The patient is in no acute distress. Patient is alert and oriented.  height is 5\' 1"  (1.549 m) and weight is 115 lb 9.6 oz (52.4 kg). Her temperature is 98 F (36.7 C). Her blood pressure is 164/69 (abnormal) and her pulse is 92. Her respiration is 20 and oxygen saturation is 100%.   Lungs are clear to auscultation bilaterally. Heart has regular rate and rhythm. No palpable cervical, supraclavicular, or axillary adenopathy. Abdomen soft, non-tender, normal bowel sounds.  On pelvic examination the external genitalia were unremarkable. A speculum exam was  performed. There are no mucosal lesions noted in the vaginal vault.  Radiation changes noted at the vaginal cuff.  on bimanual examination there were no pelvic masses appreciated.  Lab Findings: Lab Results  Component Value Date   WBC 10.0 03/12/2018   HGB 10.4 (L) 03/12/2018   HCT 33.0 (L) 03/12/2018   MCV 79.1 03/12/2018   PLT 244 03/12/2018    Radiographic Findings: No results found.  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  Plan: The  patient will follow up with gynecologic oncology in three months and with radiation oncology in 9 months since she will be 2 years out from her treatment.  ____________________________________  Blair Promise, PhD, MD  This document serves as a record of services personally performed by Gery Pray, MD. It was created on his behalf by Clerance Lav, a trained medical scribe. The creation of this record is based on the scribe's personal observations and the provider's statements to them. This document has been checked and approved by the attending provider.

## 2020-03-16 DIAGNOSIS — E119 Type 2 diabetes mellitus without complications: Secondary | ICD-10-CM | POA: Diagnosis not present

## 2020-03-16 DIAGNOSIS — Z886 Allergy status to analgesic agent status: Secondary | ICD-10-CM | POA: Diagnosis not present

## 2020-03-16 DIAGNOSIS — N3000 Acute cystitis without hematuria: Secondary | ICD-10-CM | POA: Diagnosis not present

## 2020-03-16 DIAGNOSIS — I10 Essential (primary) hypertension: Secondary | ICD-10-CM | POA: Diagnosis not present

## 2020-03-16 DIAGNOSIS — Z880 Allergy status to penicillin: Secondary | ICD-10-CM | POA: Diagnosis not present

## 2020-03-16 DIAGNOSIS — Z79899 Other long term (current) drug therapy: Secondary | ICD-10-CM | POA: Diagnosis not present

## 2020-03-16 DIAGNOSIS — N3090 Cystitis, unspecified without hematuria: Secondary | ICD-10-CM | POA: Diagnosis not present

## 2020-03-21 DIAGNOSIS — E119 Type 2 diabetes mellitus without complications: Secondary | ICD-10-CM | POA: Diagnosis not present

## 2020-03-21 DIAGNOSIS — I10 Essential (primary) hypertension: Secondary | ICD-10-CM | POA: Diagnosis not present

## 2020-03-22 ENCOUNTER — Telehealth: Payer: Self-pay | Admitting: *Deleted

## 2020-03-22 NOTE — Telephone Encounter (Signed)
CALLED PATIENT TO INFORM OF FU WITH DR. Berline Lopes ON 05-25-20 - ARRIVAL TIME- 1:30 PM, SPOKE WITH PATIENT AND SHE IS AWARE OF THIS APPT.

## 2020-04-20 DIAGNOSIS — E119 Type 2 diabetes mellitus without complications: Secondary | ICD-10-CM | POA: Diagnosis not present

## 2020-04-20 DIAGNOSIS — I10 Essential (primary) hypertension: Secondary | ICD-10-CM | POA: Diagnosis not present

## 2020-05-17 DIAGNOSIS — I10 Essential (primary) hypertension: Secondary | ICD-10-CM | POA: Diagnosis not present

## 2020-05-17 DIAGNOSIS — E119 Type 2 diabetes mellitus without complications: Secondary | ICD-10-CM | POA: Diagnosis not present

## 2020-05-25 ENCOUNTER — Ambulatory Visit: Payer: Medicare Other | Admitting: Gynecologic Oncology

## 2020-05-30 ENCOUNTER — Ambulatory Visit: Payer: Medicare Other | Admitting: Gynecologic Oncology

## 2020-05-31 NOTE — Progress Notes (Signed)
Gynecologic Oncology Return Clinic Visit  06/01/20  Reason for Visit: surveillance visit in the setting of high risk endometrial cancer  Treatment History: Oncology History Overview Note  MSI normal   Endometrial cancer ([place])  02/15/2018 Imaging   CT [facility] revealed endometrial mass measuring approximately 4.2 x 4.2 cm most concerning for endometrial carcinoma.   02/24/2018 Initial Biopsy   EMB - poorly differentiated adenocarcinoma Pap - adenocarcinoma   02/24/2018 Initial Diagnosis   Endometrial cancer ([place])   03/11/2018 Surgery   TRH/BSO, SLN bx, washings, pelvic LND, PA LND Dr. Gerarda Fraction  FIGO stage 2 Gr3 endometrioid adenocarcinoma, + sentinel ITC   05/06/2018 - 06/29/2018 Radiation Therapy   8/15-9/19: EBRT (pelvis), 1.8Gy x25 fxns for total of 45Gy 9/24, 10/1, and 10/8: HDR brachy, 6 Gy x3 fxns for total of 18 Gy   09/2018 Imaging   CT - NED but there is a mediastinal node with recommendations to follow in 6 months.    03/2019 Imaging   CT scan IMPRESSION: 1. Stable mediastinal and hilar lymph nodes. No new or progressive findings. 2. No pulmonary nodules to suggest pulmonary metastatic disease. No pleural effusion or pleural nodules. 3. Stable bilateral lower lobe bronchiectasis. 4. No CT findings for abdominal/pelvic adenopathy or metastatic disease.   11/30/2019 Cancer Staging   Staging form: Corpus Uteri - Carcinoma and Carcinosarcoma, AJCC 8th Edition - Clinical stage from 11/30/2019: FIGO Stage II (cT2, cN0(i+)(sn), cM0) - Signed by Lafonda Mosses, MD on 11/30/2019     Interval History: The patient is doing well. Denies any changes to her medical or surgical history. The patient reports overall doing well since her last visit.  She denies any vaginal bleeding or discharge.  She denies any pelvic or abdominal pain.  She reports normal bowel function.   The patient overall reports that her appetite is still so-so, unchanged for some time.  She has occasional nausea.  She  denies any early satiety.  She was seen in the emergency department in June with dysuria.  She notes getting dysuria every month or 2 and that the symptoms last for 2 to 3 days.  When she was seen at [facility] in June, her urinalysis showed trace red blood cells, no indication of infection.  She was discharged home with Pyridium.  Last saw Dr. Sondra Come in 02/2020. She is now almost 18 months out from completion of adjuvant therapy. CT C in 11/2019 showed stable mediastinal and hilar lymph nodes without new or progressive findings.   S/p COVID vaccination in the Spring.  Past Medical/Surgical History: Past Medical History:  Diagnosis Date  . Arthritis   . Diabetes ([place])     type 2  . Endometrial cancer ([place]) 02/24/2018  . Hypertension    x 5 yr  . Pneumonia     Past Surgical History:  Procedure Laterality Date  . ABDOMINAL HYSTERECTOMY     total hysterectomy Dr. Gerarda Fraction 03-11-18  . APPENDECTOMY     Open HOWEVER incision is in RUQ  . COLONOSCOPY N/A 12/14/2013   Procedure: COLONOSCOPY;  Surgeon: Rogene Houston, MD;  Location: [facility];  Service: Endoscopy;  Laterality: N/A;  100  . ESOPHAGOGASTRODUODENOSCOPY N/A 12/26/2015   Procedure: ESOPHAGOGASTRODUODENOSCOPY (EGD);  Surgeon: Rogene Houston, MD;  Location: [facility];  Service: Endoscopy;  Laterality: N/A;  2:45  . ROBOTIC ASSISTED TOTAL HYSTERECTOMY WITH BILATERAL SALPINGO OOPHERECTOMY N/A 03/11/2018   Procedure: XI ROBOTIC ASSISTED TOTAL HYSTERECTOMY WITH BILATERAL SALPINGO OOPHORECTOMY, WITH STAGING;  Surgeon:  Isabel Caprice, MD;  Location: [facility] ORS;  Service: Gynecology;  Laterality: N/A;  . SENTINEL NODE BIOPSY N/A 03/11/2018   Procedure: SENTINEL NODE BIOPSY;  Surgeon: Isabel Caprice, MD;  Location: [facility] ORS;  Service: Gynecology;  Laterality: N/A;  . TUBAL LIGATION      Family History  Problem Relation Age of Onset  . Diabetes Mother   . Diabetes Brother   . Colon cancer Neg Hx     Social History   Socioeconomic  History  . Marital status: Widowed    Spouse name: Not on file  . Number of children: Not on file  . Years of education: Not on file  . Highest education level: Not on file  Occupational History  . Not on file  Tobacco Use  . Smoking status: Never Smoker  . Smokeless tobacco: Never Used  Vaping Use  . Vaping Use: Never used  Substance and Sexual Activity  . Alcohol use: No  . Drug use: No  . Sexual activity: Yes  Other Topics Concern  . Not on file  Social History Narrative  . Not on file   Social Determinants of Health   Financial Resource Strain:   . Difficulty of Paying Living Expenses: Not on file  Food Insecurity:   . Worried About Charity fundraiser in the Last Year: Not on file  . Ran Out of Food in the Last Year: Not on file  Transportation Needs:   . Lack of Transportation (Medical): Not on file  . Lack of Transportation (Non-Medical): Not on file  Physical Activity:   . Days of Exercise per Week: Not on file  . Minutes of Exercise per Session: Not on file  Stress:   . Feeling of Stress : Not on file  Social Connections:   . Frequency of Communication with Friends and Family: Not on file  . Frequency of Social Gatherings with Friends and Family: Not on file  . Attends Religious Services: Not on file  . Active Member of Clubs or Organizations: Not on file  . Attends Archivist Meetings: Not on file  . Marital Status: Not on file    Current Medications:  Current Outpatient Medications:  .  albuterol (VENTOLIN HFA) 108 (90 Base) MCG/ACT inhaler, , Disp: , Rfl:  .  Alcohol Swabs (GLOBAL ALCOHOL PREP EASE) 70 % PADS, Apply 1 each topically daily., Disp: , Rfl:  .  amLODipine (NORVASC) 5 MG tablet, Take 5 mg by mouth daily., Disp: , Rfl:  .  atorvastatin (LIPITOR) 20 MG tablet, Take 20 mg by mouth daily., Disp: , Rfl:  .  dicyclomine (BENTYL) 20 MG tablet, Take 1 tablet (20 mg total) by mouth every 6 (six) hours as needed for spasms (abdominal  cramping)., Disp: 15 tablet, Rfl: 0 .  docusate sodium (COLACE) 100 MG capsule, Take 1 capsule (100 mg total) by mouth 2 (two) times daily., Disp: 30 capsule, Rfl: 0 .  dorzolamide (TRUSOPT) 2 % ophthalmic solution, , Disp: , Rfl:  .  hydrochlorothiazide (HYDRODIURIL) 25 MG tablet, Take 25 mg by mouth daily., Disp: , Rfl:  .  hydroxypropyl methylcellulose / hypromellose (ISOPTO TEARS / GONIOVISC) 2.5 % ophthalmic solution, Place 1 drop into both eyes at bedtime., Disp: , Rfl:  .  Lancet Devices (SIMPLE DIAGNOSTICS LANCING DEV) MISC, SMARTSIG:1 Device Topical As Directed, Disp: , Rfl:  .  Lancets MISC, SMARTSIG:1 Lancet(s) Topical Twice Daily, Disp: , Rfl:  .  metFORMIN (GLUCOPHAGE) 500 MG tablet,  Take 500 mg by mouth 2 (two) times daily with a meal. , Disp: , Rfl:  .  ONETOUCH DELICA LANCETS 33G MISC, , Disp: , Rfl:  .  ONETOUCH VERIO test strip, , Disp: , Rfl:  .  Riboflavin (VITAMIN B-2 PO), Take 125 mg by mouth daily., Disp: , Rfl:  .  sitaGLIPtin (JANUVIA) 50 MG tablet, Take 50 mg by mouth daily., Disp: , Rfl:  .  Vitamin D, Ergocalciferol, (DRISDOL) 50000 UNITS CAPS capsule, Take 50,000 Units by mouth every 7 (seven) days. Takes on Thursdays., Disp: , Rfl:   Review of Systems: Denies appetite changes, fevers, chills, fatigue, unexplained weight changes. Denies hearing loss, neck lumps or masses, mouth sores, ringing in ears or voice changes. Denies cough or wheezing.  Denies shortness of breath. Denies chest pain or palpitations. Denies leg swelling. Denies abdominal distention, pain, blood in stools, constipation, diarrhea, nausea, vomiting, or early satiety. Denies pain with intercourse, dysuria, frequency, hematuria or incontinence. Denies hot flashes, pelvic pain, vaginal bleeding or vaginal discharge.   Denies joint pain, back pain or muscle pain/cramps. Denies itching, rash, or wounds. Denies dizziness, headaches, numbness or seizures. Denies swollen lymph nodes or glands, denies  easy bruising or bleeding. Denies anxiety, depression, confusion, or decreased concentration.  Physical Exam: BP (!) 152/61 (BP Location: Left Arm, Patient Position: Sitting)   Pulse 92   Temp (!) 96.5 F (35.8 C) (Core)   Resp 18   Ht 5' 1" (1.549 m)   Wt 114 lb (51.7 kg)   SpO2 100% Comment: RA  BMI 21.54 kg/m  General: Alert, oriented, no acute distress. HEENT: Normocephalic, atraumatic, sclera anicteric. Chest: Unlabored breathing on room air. Abdomen: soft, nontender.  Normoactive bowel sounds.  No masses or hepatosplenomegaly appreciated.   Extremities: Grossly normal range of motion.  Warm, well perfused.  No edema bilaterally. Skin:  no rashes or lesions noted. Lymphatics: No cervical, supraclavicular, or inguinal adenopathy. GU: Normal appearing external genitalia without erythema, excoriation, or lesions.  Speculum exam reveals atrophic mucosa with radiation changes noted, some agglutination at right cuff.  No lesions or masses, cuff intact.  Bimanual exam reveals cuff intact, no masses.  Rectovaginal exam confirms these findings, no nodularity.  Laboratory & Radiologic Studies: None new  Assessment & Plan: Kristen Mccoy is a 79 y.o. 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  The patient is overall doing well and NED on exam today.  She is now 2 years out from completion of adjuvant radiation. Per [facility] surveillance recommendations, we will continue q 6 month visits, alternating between myself and Dr. Kinard.  We reviewed signs and symptoms that would be worrisome for disease recurrence and should prompt a phone call to be seen sooner.  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 She is relatively asymptomatic (only a couple of days every 1-2 months). She was given pyridium when she presented to [facility] ED in June with dysuria. I've encouraged her to use this medication at  the onset of symptoms next time she gets them.  25 minutes of total time was spent for this patient encounter, including preparation, face-to-face counseling with the patient and coordination of care, and documentation of the encounter.  Katherine Tucker, MD  Division of Gynecologic Oncology  Department of Obstetrics and Gynecology  [facility]   

## 2020-06-01 ENCOUNTER — Other Ambulatory Visit: Payer: Self-pay

## 2020-06-01 ENCOUNTER — Encounter: Payer: Self-pay | Admitting: Gynecologic Oncology

## 2020-06-01 ENCOUNTER — Inpatient Hospital Stay: Payer: Medicare Other | Attending: Gynecologic Oncology | Admitting: Gynecologic Oncology

## 2020-06-01 VITALS — BP 152/61 | HR 92 | Temp 96.5°F | Resp 18 | Ht 61.0 in | Wt 114.0 lb

## 2020-06-01 DIAGNOSIS — Z8542 Personal history of malignant neoplasm of other parts of uterus: Secondary | ICD-10-CM

## 2020-06-01 DIAGNOSIS — Z90722 Acquired absence of ovaries, bilateral: Secondary | ICD-10-CM | POA: Diagnosis not present

## 2020-06-01 DIAGNOSIS — N3040 Irradiation cystitis without hematuria: Secondary | ICD-10-CM

## 2020-06-01 DIAGNOSIS — Z79899 Other long term (current) drug therapy: Secondary | ICD-10-CM | POA: Insufficient documentation

## 2020-06-01 DIAGNOSIS — I10 Essential (primary) hypertension: Secondary | ICD-10-CM | POA: Insufficient documentation

## 2020-06-01 DIAGNOSIS — Z90710 Acquired absence of both cervix and uterus: Secondary | ICD-10-CM | POA: Diagnosis not present

## 2020-06-01 DIAGNOSIS — E119 Type 2 diabetes mellitus without complications: Secondary | ICD-10-CM | POA: Insufficient documentation

## 2020-06-01 DIAGNOSIS — C541 Malignant neoplasm of endometrium: Secondary | ICD-10-CM

## 2020-06-01 DIAGNOSIS — Z923 Personal history of irradiation: Secondary | ICD-10-CM | POA: Diagnosis not present

## 2020-06-01 DIAGNOSIS — Z833 Family history of diabetes mellitus: Secondary | ICD-10-CM | POA: Diagnosis not present

## 2020-06-01 DIAGNOSIS — Z7984 Long term (current) use of oral hypoglycemic drugs: Secondary | ICD-10-CM | POA: Insufficient documentation

## 2020-06-01 NOTE — Patient Instructions (Signed)
Your exam is normal today! I would like to see you 6 months after your visit with Dr. Sondra Come in March. Please call our office in May 2022 to get a visit scheduled for next September. If you develop any new symptoms before your next scheduled visit (like vaginal bleeding, abdominal pain), please call the clinic to be seen sooner.

## 2020-06-21 DIAGNOSIS — I10 Essential (primary) hypertension: Secondary | ICD-10-CM | POA: Diagnosis not present

## 2020-06-21 DIAGNOSIS — E119 Type 2 diabetes mellitus without complications: Secondary | ICD-10-CM | POA: Diagnosis not present

## 2020-06-28 DIAGNOSIS — Z299 Encounter for prophylactic measures, unspecified: Secondary | ICD-10-CM | POA: Diagnosis not present

## 2020-06-28 DIAGNOSIS — E1165 Type 2 diabetes mellitus with hyperglycemia: Secondary | ICD-10-CM | POA: Diagnosis not present

## 2020-06-28 DIAGNOSIS — Z6822 Body mass index (BMI) 22.0-22.9, adult: Secondary | ICD-10-CM | POA: Diagnosis not present

## 2020-06-28 DIAGNOSIS — E7800 Pure hypercholesterolemia, unspecified: Secondary | ICD-10-CM | POA: Diagnosis not present

## 2020-06-28 DIAGNOSIS — I10 Essential (primary) hypertension: Secondary | ICD-10-CM | POA: Diagnosis not present

## 2020-07-11 DIAGNOSIS — H524 Presbyopia: Secondary | ICD-10-CM | POA: Diagnosis not present

## 2020-07-11 DIAGNOSIS — H401211 Low-tension glaucoma, right eye, mild stage: Secondary | ICD-10-CM | POA: Diagnosis not present

## 2020-07-20 DIAGNOSIS — E119 Type 2 diabetes mellitus without complications: Secondary | ICD-10-CM | POA: Diagnosis not present

## 2020-07-20 DIAGNOSIS — I10 Essential (primary) hypertension: Secondary | ICD-10-CM | POA: Diagnosis not present

## 2020-08-13 DIAGNOSIS — R079 Chest pain, unspecified: Secondary | ICD-10-CM | POA: Diagnosis not present

## 2020-08-13 DIAGNOSIS — Z299 Encounter for prophylactic measures, unspecified: Secondary | ICD-10-CM | POA: Diagnosis not present

## 2020-08-13 DIAGNOSIS — Z6822 Body mass index (BMI) 22.0-22.9, adult: Secondary | ICD-10-CM | POA: Diagnosis not present

## 2020-08-13 DIAGNOSIS — I10 Essential (primary) hypertension: Secondary | ICD-10-CM | POA: Diagnosis not present

## 2020-08-13 DIAGNOSIS — Z713 Dietary counseling and surveillance: Secondary | ICD-10-CM | POA: Diagnosis not present

## 2020-08-20 DIAGNOSIS — H401231 Low-tension glaucoma, bilateral, mild stage: Secondary | ICD-10-CM | POA: Diagnosis not present

## 2020-08-21 DIAGNOSIS — I10 Essential (primary) hypertension: Secondary | ICD-10-CM | POA: Diagnosis not present

## 2020-08-21 DIAGNOSIS — E119 Type 2 diabetes mellitus without complications: Secondary | ICD-10-CM | POA: Diagnosis not present

## 2020-09-20 DIAGNOSIS — I10 Essential (primary) hypertension: Secondary | ICD-10-CM | POA: Diagnosis not present

## 2020-09-20 DIAGNOSIS — E119 Type 2 diabetes mellitus without complications: Secondary | ICD-10-CM | POA: Diagnosis not present

## 2020-10-02 DIAGNOSIS — E1165 Type 2 diabetes mellitus with hyperglycemia: Secondary | ICD-10-CM | POA: Diagnosis not present

## 2020-10-02 DIAGNOSIS — Z789 Other specified health status: Secondary | ICD-10-CM | POA: Diagnosis not present

## 2020-10-02 DIAGNOSIS — E1129 Type 2 diabetes mellitus with other diabetic kidney complication: Secondary | ICD-10-CM | POA: Diagnosis not present

## 2020-10-02 DIAGNOSIS — R809 Proteinuria, unspecified: Secondary | ICD-10-CM | POA: Diagnosis not present

## 2020-10-02 DIAGNOSIS — I10 Essential (primary) hypertension: Secondary | ICD-10-CM | POA: Diagnosis not present

## 2020-10-02 DIAGNOSIS — Z299 Encounter for prophylactic measures, unspecified: Secondary | ICD-10-CM | POA: Diagnosis not present

## 2020-10-02 DIAGNOSIS — Z6822 Body mass index (BMI) 22.0-22.9, adult: Secondary | ICD-10-CM | POA: Diagnosis not present

## 2020-11-19 DIAGNOSIS — I10 Essential (primary) hypertension: Secondary | ICD-10-CM | POA: Diagnosis not present

## 2020-11-19 DIAGNOSIS — E7800 Pure hypercholesterolemia, unspecified: Secondary | ICD-10-CM | POA: Diagnosis not present

## 2020-11-28 DIAGNOSIS — Z299 Encounter for prophylactic measures, unspecified: Secondary | ICD-10-CM | POA: Diagnosis not present

## 2020-11-28 DIAGNOSIS — R5383 Other fatigue: Secondary | ICD-10-CM | POA: Diagnosis not present

## 2020-11-28 DIAGNOSIS — E559 Vitamin D deficiency, unspecified: Secondary | ICD-10-CM | POA: Diagnosis not present

## 2020-11-28 DIAGNOSIS — E7800 Pure hypercholesterolemia, unspecified: Secondary | ICD-10-CM | POA: Diagnosis not present

## 2020-11-28 DIAGNOSIS — I10 Essential (primary) hypertension: Secondary | ICD-10-CM | POA: Diagnosis not present

## 2020-11-28 DIAGNOSIS — Z7189 Other specified counseling: Secondary | ICD-10-CM | POA: Diagnosis not present

## 2020-11-28 DIAGNOSIS — Z0000 Encounter for general adult medical examination without abnormal findings: Secondary | ICD-10-CM | POA: Diagnosis not present

## 2020-11-28 DIAGNOSIS — Z79899 Other long term (current) drug therapy: Secondary | ICD-10-CM | POA: Diagnosis not present

## 2020-11-28 DIAGNOSIS — Z6823 Body mass index (BMI) 23.0-23.9, adult: Secondary | ICD-10-CM | POA: Diagnosis not present

## 2020-11-29 ENCOUNTER — Encounter: Payer: Self-pay | Admitting: *Deleted

## 2020-11-30 ENCOUNTER — Telehealth: Payer: Self-pay | Admitting: *Deleted

## 2020-11-30 NOTE — Telephone Encounter (Signed)
CALLED PATIENT TO ALTER FU ON 12-03-20 DUE TO DR. KINARD BEING IN THE OR, RESCHEDULED FOR 01-03-21 @ 11:30 AM , PATIENT AGREED TO NEW DATE AND TIME

## 2020-12-03 ENCOUNTER — Ambulatory Visit: Payer: Self-pay | Admitting: Radiation Oncology

## 2020-12-18 ENCOUNTER — Encounter (INDEPENDENT_AMBULATORY_CARE_PROVIDER_SITE_OTHER): Payer: Self-pay | Admitting: *Deleted

## 2020-12-18 DIAGNOSIS — H401211 Low-tension glaucoma, right eye, mild stage: Secondary | ICD-10-CM | POA: Diagnosis not present

## 2021-01-03 ENCOUNTER — Ambulatory Visit
Admission: RE | Admit: 2021-01-03 | Discharge: 2021-01-03 | Disposition: A | Discharge status: Home / Self Care | Payer: Medicare Other | Source: Ambulatory Visit | Attending: Radiation Oncology | Admitting: Radiation Oncology

## 2021-01-03 ENCOUNTER — Other Ambulatory Visit: Payer: Self-pay

## 2021-01-03 ENCOUNTER — Encounter: Payer: Self-pay | Admitting: Radiation Oncology

## 2021-01-03 VITALS — BP 155/69 | HR 103 | Temp 97.6°F | Resp 18 | Ht 61.0 in | Wt 117.8 lb

## 2021-01-03 DIAGNOSIS — Z8542 Personal history of malignant neoplasm of other parts of uterus: Secondary | ICD-10-CM | POA: Insufficient documentation

## 2021-01-03 DIAGNOSIS — C541 Malignant neoplasm of endometrium: Secondary | ICD-10-CM

## 2021-01-03 DIAGNOSIS — Z923 Personal history of irradiation: Secondary | ICD-10-CM | POA: Diagnosis not present

## 2021-01-03 DIAGNOSIS — Z79899 Other long term (current) drug therapy: Secondary | ICD-10-CM | POA: Insufficient documentation

## 2021-01-03 DIAGNOSIS — Z08 Encounter for follow-up examination after completed treatment for malignant neoplasm: Secondary | ICD-10-CM | POA: Diagnosis not present

## 2021-01-03 NOTE — Progress Notes (Signed)
She is currently in no pain.  Patient complains of Poor Appetite. Reports urinary frequency, nausea and vomiting Dribbling and Urgency.    Patient states they urinate 1 - 2 times per night.   Patient reports Nausea and Vomiting, occasional diarrhea, a bowel movement every day.   Patient reports None.    Patient is using their vaginal dilator twice weekly.   Vitals:   01/03/21 1134  BP: (!) 155/69  Pulse: (!) 103  Resp: 18  Temp: 97.6 F (36.4 C)  SpO2: 98%  Weight: 117 lb 12.8 oz (53.4 kg)  Height: 5\' 1"  (1.549 m)

## 2021-01-03 NOTE — Progress Notes (Signed)
Radiation Oncology         [phone] ________________________________  Name: Kristen Mccoy MRN: [id number]  Date: 01/03/2021  DOB: [birth year]  Follow-Up Visit Note  CC: Monico Blitz, MD  Isabel Caprice, MD    ICD-10-CM   1. Endometrial cancer ([place])  C54.1     Diagnosis: Stage II, grade 3 endometrial cancer with isolated tumor cells on sentinel node [pT2,pN0(i+)]  Interval Since Last Radiation: 2-1/2 years  Radiation treatment dates:  05/06/2018 - 06/10/2018; HDR: 06/15/2018, 06/22/2018, and 06/29/2018  Site/dose:  1. Pelvis / 1.8 Gy x 25 fractions for a total dose of 45 Gy 2. Vaginal cuff / 6 Gy x 3 fractions for a total dose of 18 Gy  Narrative: The patient returns today for routine follow-up. She is doing well overall. Since her last visit, she denies any new medical issues  She followed-up with Dr. Berline Lopes on 06/01/2020, during which time she was noted to be doing well and remained under surveillance.  On review of systems, she reports no pain, vaginal bleeding/discharge, or urinary symptoms.  .    ALLERGIES:  is allergic to aspirin and penicillins.  Meds: Current Outpatient Medications  Medication Sig Dispense Refill  . albuterol (VENTOLIN HFA) 108 (90 Base) MCG/ACT inhaler     . Alcohol Swabs (GLOBAL ALCOHOL PREP EASE) 70 % PADS Apply 1 each topically daily.    Marland Kitchen amLODipine (NORVASC) 5 MG tablet Take 5 mg by mouth daily.    Marland Kitchen atorvastatin (LIPITOR) 20 MG tablet Take 20 mg by mouth daily.    . Blood Glucose Monitoring Suppl (ACCU-CHEK GUIDE) w/Device KIT USE 1 ONCE DAILY    . dorzolamide (TRUSOPT) 2 % ophthalmic solution     . hydrochlorothiazide (HYDRODIURIL) 25 MG tablet Take 25 mg by mouth daily.    . hydroxypropyl methylcellulose / hypromellose (ISOPTO TEARS / GONIOVISC) 2.5 % ophthalmic solution Place 1 drop into both eyes at bedtime.    Elmore Guise Devices (SIMPLE DIAGNOSTICS LANCING DEV) MISC SMARTSIG:1 Device Topical As Directed    . Lancets MISC  SMARTSIG:1 Lancet(s) Topical Twice Daily    . metFORMIN (GLUCOPHAGE) 500 MG tablet Take 500 mg by mouth 2 (two) times daily with a meal.     . ONETOUCH DELICA LANCETS 93O MISC     . ONETOUCH VERIO test strip     . Riboflavin (VITAMIN B-2 PO) Take 125 mg by mouth daily.    . sitaGLIPtin (JANUVIA) 50 MG tablet Take 50 mg by mouth daily.    . Travoprost, BAK Free, (TRAVATAN) 0.004 % SOLN ophthalmic solution 1 drop at bedtime.    . Vitamin D, Ergocalciferol, (DRISDOL) 50000 UNITS CAPS capsule Take 50,000 Units by mouth every 7 (seven) days. Takes on Thursdays.    Marland Kitchen dicyclomine (BENTYL) 20 MG tablet Take 1 tablet (20 mg total) by mouth every 6 (six) hours as needed for spasms (abdominal cramping). (Patient not taking: Reported on 01/03/2021) 15 tablet 0  . docusate sodium (COLACE) 100 MG capsule Take 1 capsule (100 mg total) by mouth 2 (two) times daily. (Patient not taking: Reported on 01/03/2021) 30 capsule 0   No current facility-administered medications for this encounter.    Physical Findings: The patient is in no acute distress. Patient is alert and oriented.  height is 5' 1"  (1.549 m) and weight is 117 lb 12.8 oz (53.4 kg). Her temperature is 97.6 F (36.4 C). Her blood pressure is 155/69 (abnormal) and her pulse is 103 (abnormal). Her respiration  is 18 and oxygen saturation is 98%.   Lungs are clear to auscultation bilaterally. Heart has regular rate and rhythm. No palpable cervical, supraclavicular, or axillary adenopathy. Abdomen soft, non-tender, normal bowel sounds.  On pelvic examination the external genitalia were unremarkable. A speculum exam was performed. There are no mucosal lesions noted in the vaginal vault.  Radiation changes noted at the vaginal cuff.  on bimanual and rectovaginal examination there were no pelvic masses appreciated.  Rectal sphincter tone somewhat decreased.  Lab Findings: Lab Results  Component Value Date   WBC 10.0 03/12/2018   HGB 10.4 (L) 03/12/2018    HCT 33.0 (L) 03/12/2018   MCV 79.1 03/12/2018   PLT 244 03/12/2018    Radiographic Findings: No results found.  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  Plan: The patient will follow up with gynecologic oncology in 6 months and with radiation oncology in 12 months since she is now out 2 years out from her treatment.  ____________________________________  Blair Promise, PhD, MD  This document serves as a record of services personally performed by Gery Pray, MD. It was created on his behalf by Clerance Lav, a trained medical scribe. The creation of this record is based on the scribe's personal observations and the provider's statements to them. This document has been checked and approved by the attending provider.

## 2021-01-08 DIAGNOSIS — E1142 Type 2 diabetes mellitus with diabetic polyneuropathy: Secondary | ICD-10-CM | POA: Diagnosis not present

## 2021-01-08 DIAGNOSIS — Z299 Encounter for prophylactic measures, unspecified: Secondary | ICD-10-CM | POA: Diagnosis not present

## 2021-01-08 DIAGNOSIS — E1165 Type 2 diabetes mellitus with hyperglycemia: Secondary | ICD-10-CM | POA: Diagnosis not present

## 2021-01-08 DIAGNOSIS — I10 Essential (primary) hypertension: Secondary | ICD-10-CM | POA: Diagnosis not present

## 2021-01-08 DIAGNOSIS — E11319 Type 2 diabetes mellitus with unspecified diabetic retinopathy without macular edema: Secondary | ICD-10-CM | POA: Diagnosis not present

## 2021-01-10 ENCOUNTER — Encounter (INDEPENDENT_AMBULATORY_CARE_PROVIDER_SITE_OTHER): Payer: Self-pay | Admitting: *Deleted

## 2021-01-10 ENCOUNTER — Telehealth (INDEPENDENT_AMBULATORY_CARE_PROVIDER_SITE_OTHER): Payer: Self-pay | Admitting: *Deleted

## 2021-01-10 ENCOUNTER — Other Ambulatory Visit (INDEPENDENT_AMBULATORY_CARE_PROVIDER_SITE_OTHER): Payer: Self-pay | Admitting: *Deleted

## 2021-01-10 MED ORDER — PEG 3350-KCL-NA BICARB-NACL 420 G PO SOLR: 4000.0000 mL | Freq: Once | ORAL | 0 refills | Status: AC

## 2021-01-10 NOTE — Telephone Encounter (Signed)
Done

## 2021-01-10 NOTE — Telephone Encounter (Signed)
Patient needs trilyte 

## 2021-01-11 ENCOUNTER — Telehealth (INDEPENDENT_AMBULATORY_CARE_PROVIDER_SITE_OTHER): Payer: Self-pay | Admitting: *Deleted

## 2021-01-11 ENCOUNTER — Other Ambulatory Visit (INDEPENDENT_AMBULATORY_CARE_PROVIDER_SITE_OTHER): Payer: Self-pay

## 2021-01-11 DIAGNOSIS — Z86010 Personal history of colonic polyps: Secondary | ICD-10-CM

## 2021-01-11 NOTE — Telephone Encounter (Signed)
Referring MD/PCP: shah  Procedure: tcs  Reason/Indication:  Hx polyps  Has patient had this procedure before?  Yes, 2015  If so, when, by whom and where?    Is there a family history of colon cancer?  no  Who?  What age when diagnosed?    Is patient diabetic?    yes      Does patient have prosthetic heart valve or mechanical valve?  no  Do you have a pacemaker/defibrillator?  no  Has patient ever had endocarditis/atrial fibrillation? no  Have you had a stroke/heart attack last 6 mths? no  Does patient use oxygen? no  Has patient had joint replacement within last 12 months? no  Is patient constipated or do they take laxatives? no  Does patient have a history of alcohol/drug use?  no  Is patient on blood thinner such as Coumadin, Plavix and/or Aspirin? no  Do you take medicine for weight loss?  no  Medications: amlodipine 5 mg daily, hctz 25 mg daily, metformin 500 mg bid, vit b daily  Allergies: asa, pcn  Medication Adjustment per Dr Rehman/Dr Jenetta Downer hold metformin evening before and morning of procedure  Procedure date & time: 02/07/21

## 2021-02-04 ENCOUNTER — Encounter (INDEPENDENT_AMBULATORY_CARE_PROVIDER_SITE_OTHER): Payer: Self-pay

## 2021-02-06 ENCOUNTER — Other Ambulatory Visit (HOSPITAL_COMMUNITY)
Admission: RE | Admit: 2021-02-06 | Discharge: 2021-02-06 | Disposition: A | Discharge status: Home / Self Care | Payer: Medicare Other | Source: Ambulatory Visit | Attending: Internal Medicine | Admitting: Internal Medicine

## 2021-02-06 ENCOUNTER — Other Ambulatory Visit: Payer: Self-pay

## 2021-02-06 DIAGNOSIS — Z01812 Encounter for preprocedural laboratory examination: Secondary | ICD-10-CM | POA: Diagnosis not present

## 2021-02-06 DIAGNOSIS — Z20822 Contact with and (suspected) exposure to covid-19: Secondary | ICD-10-CM | POA: Diagnosis not present

## 2021-02-06 LAB — SARS CORONAVIRUS 2 (TAT 6-24 HRS): SARS Coronavirus 2: NEGATIVE

## 2021-02-07 ENCOUNTER — Encounter (HOSPITAL_COMMUNITY)
Admission: RE | Disposition: A | Discharge status: Home / Self Care | Payer: Self-pay | Source: Home / Self Care | Attending: Internal Medicine

## 2021-02-07 ENCOUNTER — Encounter (HOSPITAL_COMMUNITY): Payer: Self-pay | Admitting: Internal Medicine

## 2021-02-07 ENCOUNTER — Other Ambulatory Visit: Payer: Self-pay

## 2021-02-07 ENCOUNTER — Ambulatory Visit (HOSPITAL_COMMUNITY)
Admission: RE | Admit: 2021-02-07 | Discharge: 2021-02-07 | Disposition: A | Discharge status: Home / Self Care | Payer: Medicare Other | Attending: Internal Medicine | Admitting: Internal Medicine

## 2021-02-07 DIAGNOSIS — K5669 Other partial intestinal obstruction: Secondary | ICD-10-CM | POA: Insufficient documentation

## 2021-02-07 DIAGNOSIS — Z7984 Long term (current) use of oral hypoglycemic drugs: Secondary | ICD-10-CM | POA: Insufficient documentation

## 2021-02-07 DIAGNOSIS — K56699 Other intestinal obstruction unspecified as to partial versus complete obstruction: Secondary | ICD-10-CM | POA: Diagnosis not present

## 2021-02-07 DIAGNOSIS — Z923 Personal history of irradiation: Secondary | ICD-10-CM | POA: Diagnosis not present

## 2021-02-07 DIAGNOSIS — Z886 Allergy status to analgesic agent status: Secondary | ICD-10-CM | POA: Insufficient documentation

## 2021-02-07 DIAGNOSIS — Z1211 Encounter for screening for malignant neoplasm of colon: Secondary | ICD-10-CM | POA: Insufficient documentation

## 2021-02-07 DIAGNOSIS — Z880 Allergy status to penicillin: Secondary | ICD-10-CM | POA: Diagnosis not present

## 2021-02-07 DIAGNOSIS — I10 Essential (primary) hypertension: Secondary | ICD-10-CM | POA: Diagnosis not present

## 2021-02-07 DIAGNOSIS — Z7951 Long term (current) use of inhaled steroids: Secondary | ICD-10-CM | POA: Diagnosis not present

## 2021-02-07 DIAGNOSIS — Z8542 Personal history of malignant neoplasm of other parts of uterus: Secondary | ICD-10-CM | POA: Diagnosis not present

## 2021-02-07 DIAGNOSIS — Z881 Allergy status to other antibiotic agents status: Secondary | ICD-10-CM | POA: Diagnosis not present

## 2021-02-07 DIAGNOSIS — Z09 Encounter for follow-up examination after completed treatment for conditions other than malignant neoplasm: Secondary | ICD-10-CM | POA: Diagnosis not present

## 2021-02-07 DIAGNOSIS — Z86010 Personal history of colonic polyps: Secondary | ICD-10-CM | POA: Diagnosis not present

## 2021-02-07 DIAGNOSIS — Z833 Family history of diabetes mellitus: Secondary | ICD-10-CM | POA: Insufficient documentation

## 2021-02-07 DIAGNOSIS — Z79899 Other long term (current) drug therapy: Secondary | ICD-10-CM | POA: Diagnosis not present

## 2021-02-07 HISTORY — PX: FLEXIBLE SIGMOIDOSCOPY: SHX5431

## 2021-02-07 LAB — GLUCOSE, CAPILLARY: Glucose-Capillary: 96 mg/dL (ref 70–99)

## 2021-02-07 SURGERY — SIGMOIDOSCOPY, FLEXIBLE
Anesthesia: Moderate Sedation

## 2021-02-07 MED ORDER — MEPERIDINE HCL 50 MG/ML IJ SOLN
INTRAMUSCULAR | Status: DC | PRN
  Administered 2021-02-07: 20 mg via INTRAVENOUS
  Administered 2021-02-07 (×2): 10 mg via INTRAVENOUS

## 2021-02-07 MED ORDER — MIDAZOLAM HCL 5 MG/5ML IJ SOLN
INTRAMUSCULAR | Status: AC
  Filled 2021-02-07: qty 10

## 2021-02-07 MED ORDER — MEPERIDINE HCL 50 MG/ML IJ SOLN
INTRAMUSCULAR | Status: AC
  Filled 2021-02-07: qty 1

## 2021-02-07 MED ORDER — STERILE WATER FOR IRRIGATION IR SOLN
Status: DC | PRN
  Administered 2021-02-07: 2.5 mL

## 2021-02-07 MED ORDER — SODIUM CHLORIDE 0.9 % IV SOLN: INTRAVENOUS | Status: DC

## 2021-02-07 MED ORDER — MIDAZOLAM HCL 5 MG/5ML IJ SOLN
INTRAMUSCULAR | Status: DC | PRN
  Administered 2021-02-07 (×2): 2 mg via INTRAVENOUS

## 2021-02-07 NOTE — Op Note (Signed)
[facility] Patient Name: Kristen Mccoy Procedure Date: 02/07/2021 12:32 PM MRN: [id number] Date of Birth: 04-Jan-1941 Attending MD: Hildred Laser , MD CSN: [id number] Age: 80 Admit Type: Outpatient Procedure:                Colonoscopy Indications:              High risk colon cancer surveillance: Personal                            history of colonic polyps Providers:                Hildred Laser, MD, Gwynneth Albright RN, RN,                            Raphael Gibney, Technician Referring MD:             Monico Blitz, MD Medicines:                Meperidine 40 mg IV, Midazolam 4 mg IV Complications:            No immediate complications. Estimated Blood Loss:     Estimated blood loss: none. Procedure:                Pre-Anesthesia Assessment:                           - Prior to the procedure, a History and Physical                            was performed, and patient medications and                            allergies were reviewed. The patient's tolerance of                            previous anesthesia was also reviewed. The risks                            and benefits of the procedure and the sedation                            options and risks were discussed with the patient.                            All questions were answered, and informed consent                            was obtained. Prior Anticoagulants: The patient has                            taken no previous anticoagulant or antiplatelet                            agents. ASA Grade Assessment: II - A patient with  mild systemic disease. After reviewing the risks                            and benefits, the patient was deemed in                            satisfactory condition to undergo the procedure.                           After obtaining informed consent, the colonoscope                            was passed under direct vision. Throughout the                             procedure, the patient's blood pressure, pulse, and                            oxygen saturations were monitored continuously. The                            PCF-[id number] ([id number]) was introduced through the                            anus and advanced to the the sigmoid colon to                            examine a stenosis. This was the intended extent.                            The colonoscopy was technically difficult and                            complex due to restricted mobility of the colon.                            The patient tolerated the procedure well. The                            quality of the bowel preparation was good. The                            rectum was photographed. Scope In: 12:57:59 PM Scope Out: 1:14:56 PM Total Procedure Duration: 0 hours 16 minutes 57 seconds  Findings:      The perianal and digital rectal examinations were normal.      A benign-appearing, intrinsic moderate stenosis was found in the mid       sigmoid colon and was traversed.      The distal sigmoid colon appeared normal.      The retroflexed view of the distal rectum and anal verge was normal and       showed no anal or rectal abnormalities. 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                           - The distal sigmoid colon is normal.                           - No specimens collected.                           Comment: Very noncompliant sigmoid colon with                            stricture in the mid segment difficult to see but                            traversed with ultraslim scope. Did not attempt to                            complete exam as patient was uncomfortable. Moderate Sedation:      Moderate (conscious) sedation was administered by the endoscopy nurse       and supervised by the endoscopist. The following parameters were       monitored:  oxygen saturation, heart rate, blood pressure, CO2       capnography and response to care. Total physician intraservice time was       21 minutes. Recommendation:           - Patient has a contact number available for                            emergencies. The signs and symptoms of potential                            delayed complications were discussed with the                            patient. Return to normal activities tomorrow.                            Written discharge instructions were provided to the                            patient.                           - Resume previous diet today.                           - Continue present medications.                           -Will arrange for single column enema with  water-soluble contrast. Procedure Code(s):        --- Professional ---                           [phone], 51, Colonoscopy, flexible; diagnostic,                            including collection of specimen(s) by brushing or                            washing, when performed (separate procedure)                           G0500, Moderate sedation services provided by the                            same physician or other qualified health care                            professional performing a gastrointestinal                            endoscopic service that sedation supports,                            requiring the presence of an independent trained                            observer to assist in the monitoring of the                            patient's level of consciousness and physiological                            status; initial 15 minutes of intra-service time;                            patient age 101 years or older (additional time may                            be reported with [phone], as appropriate) Diagnosis Code(s):        --- Professional ---                           Z86.010, Personal history of colonic polyps                            K56.699, Other intestinal obstruction unspecified                            as to partial versus complete obstruction CPT copyright 2019 American Medical Association. All rights reserved. The codes documented in this report are preliminary and upon coder review may  be revised to meet current compliance requirements. Hildred Laser, MD Hildred Laser, MD 02/07/2021 1:24:34 PM This report has been signed electronically.  Number of Addenda: 0 

## 2021-02-07 NOTE — H&P (Signed)
Kristen Mccoy is an 80 y.o. female.   Chief Complaint: Patient is here for colonoscopy. HPI: Patient is 80 year old African-American female who is here for surveillance colonoscopy.  Her last colonoscopy was in March 2015 and she had a small tubular adenoma removed from her transverse colon.  She was advised to return in 7 years.  She has no GI complaints.  Bowels move regularly.  She denies abdominal pain or rectal bleeding. Family history significant for endometrial carcinoma for which she had surgery in radiation therapy. Family history is negative for CRC. She does not take aspirin or anticoagulants.  Past Medical History:  Diagnosis Date  . Arthritis   . Diabetes ([place])     type 2  . Endometrial cancer ([place]) 02/24/2018  . History of radiation therapy 05/06/2018-06/29/2018   25 fx Pelvis, 3 fx HDR Vaginal Cuff; Dr. Gery Pray  . Hypertension    x 5 yr  . Pneumonia     Past Surgical History:  Procedure Laterality Date  . ABDOMINAL HYSTERECTOMY     total hysterectomy Dr. Gerarda Fraction 03-11-18  . APPENDECTOMY     Open HOWEVER incision is in RUQ  . COLONOSCOPY N/A 12/14/2013   Procedure: COLONOSCOPY;  Surgeon: Rogene Houston, MD;  [facility];  Service: Endoscopy;  Laterality: N/A;  100  . ESOPHAGOGASTRODUODENOSCOPY N/A 12/26/2015   Procedure: ESOPHAGOGASTRODUODENOSCOPY (EGD);  Surgeon: Rogene Houston, MD;  [facility];  Service: Endoscopy;  Laterality: N/A;  2:45  . ROBOTIC ASSISTED TOTAL HYSTERECTOMY WITH BILATERAL SALPINGO OOPHERECTOMY N/A 03/11/2018   Procedure: XI ROBOTIC ASSISTED TOTAL HYSTERECTOMY WITH BILATERAL SALPINGO OOPHORECTOMY, WITH STAGING;  Surgeon: Isabel Caprice, MD;  Location: [facility];  Service: Gynecology;  Laterality: N/A;  . SENTINEL NODE BIOPSY N/A 03/11/2018   Procedure: SENTINEL NODE BIOPSY;  Surgeon: Isabel Caprice, MD;  Location: [facility];  Service: Gynecology;  Laterality: N/A;  . TUBAL LIGATION      Family History  Problem  Relation Age of Onset  . Diabetes Mother   . Diabetes Brother   . Colon cancer Neg Hx    Social History:  reports that she has never smoked. She has never used smokeless tobacco. She reports that she does not drink alcohol and does not use drugs.  Allergies:  Allergies  Allergen Reactions  . Aspirin Itching, Swelling and Rash       . Penicillins Other (See Comments) and Rash    RECEIVED cephalosporin in OR 03/11/18 without incident PCN no problem as a child, but then as adult "my mouth went sideway" Other reaction(s): Headache MOUTH WENT SIDEWAYS.  Other reaction(s): Other (See Comments) RECEIVED cephalosporin in OR 03/11/18 without incident PCN no problem as a child, but then as adult "my mouth went sideway"    Medications Prior to Admission  Medication Sig Dispense Refill  . amLODipine (NORVASC) 5 MG tablet Take 5 mg by mouth in the morning.    Marland Kitchen atorvastatin (LIPITOR) 20 MG tablet Take 20 mg by mouth in the morning.    . Cholecalciferol (VITAMIN D-3) 125 MCG (5000 UT) TABS Take 5,000 Units by mouth in the morning.    . dorzolamide (TRUSOPT) 2 % ophthalmic solution Place 1 drop into both eyes in the morning and at bedtime.    . hydrochlorothiazide (HYDRODIURIL) 25 MG tablet Take 25 mg by mouth in the morning.    . metFORMIN (GLUCOPHAGE) 500 MG tablet Take 1,000 mg by mouth 2 (two) times daily with a meal.    .  sitaGLIPtin (JANUVIA) 50 MG tablet Take 50 mg by mouth in the morning.    Marland Kitchen tiZANidine (ZANAFLEX) 4 MG tablet Take 4 mg by mouth daily as needed for muscle spasms.    . Travoprost, BAK Free, (TRAVATAN) 0.004 % SOLN ophthalmic solution Place 1 drop into both eyes at bedtime.    . vitamin C (ASCORBIC ACID) 500 MG tablet Take 500 mg by mouth in the morning.    Marland Kitchen albuterol (VENTOLIN HFA) 108 (90 Base) MCG/ACT inhaler Inhale 1-2 puffs into the lungs every 4 (four) hours as needed for shortness of breath or wheezing.    . Alcohol Swabs (GLOBAL ALCOHOL PREP EASE) 70 % PADS Apply 1  each topically daily.    . Blood Glucose Monitoring Suppl (ACCU-CHEK GUIDE) w/Device KIT USE 1 ONCE DAILY    . Lancets MISC SMARTSIG:1 Lancet(s) Topical Twice Daily      Results for orders placed or performed during the hospital encounter of 02/07/21 (from the past 48 hour(s))  Glucose, capillary     Status: None   Collection Time: 02/07/21 11:55 AM  Result Value Ref Range   Glucose-Capillary 96 70 - 99 mg/dL    Comment: Glucose reference range applies only to samples taken after fasting for at least 8 hours.   No results found.  Review of Systems  Blood pressure (!) 160/76, pulse 89, temperature 98 F (36.7 C), temperature source Oral, resp. rate 16, height 5' 1"  (1.549 m), weight 50.8 kg, SpO2 (!) 17 %. Physical Exam HENT:     Mouth/Throat:     Mouth: Mucous membranes are moist.     Pharynx: Oropharynx is clear.  Eyes:     General: No scleral icterus.    Conjunctiva/sclera: Conjunctivae normal.  Cardiovascular:     Rate and Rhythm: Normal rate and regular rhythm.     Heart sounds: Murmur heard.      Comments: Grade 2/6 systolic murmur best at aortic area. Pulmonary:     Effort: Pulmonary effort is normal.     Breath sounds: Normal breath sounds.  Abdominal:     Comments: She has horizontal scar in right upper quadrant. Abdomen soft and nontender with organomegaly or masses.  Musculoskeletal:        General: No swelling.     Cervical back: Neck supple.  Lymphadenopathy:     Cervical: No cervical adenopathy.  Skin:    General: Skin is warm and dry.  Neurological:     Mental Status: She is alert.      Assessment/Plan  History of colonic adenoma. Surveillance colonoscopy.  Hildred Laser, MD 02/07/2021, 12:44 PM

## 2021-02-07 NOTE — Discharge Instructions (Signed)
Resume usual medications and diet as before. No driving for 24 hours. Will arrange for single contrast barium enema.  Office will call.   Colonoscopy, Adult, Care After This sheet gives you information about how to care for yourself after your procedure. Your doctor may also give you more specific instructions. If you have problems or questions, call your doctor. What can I expect after the procedure? After the procedure, it is common to have:  A small amount of blood in your poop (stool) for 24 hours.  Some gas.  Mild cramping or bloating in your belly (abdomen). Follow these instructions at home: Eating and drinking  Drink enough fluid to keep your pee (urine) pale yellow.  Follow instructions from your doctor about what you cannot eat or drink.  Return to your normal diet as told by your doctor. Avoid heavy or fried foods that are hard to digest.   Activity  Rest as told by your doctor.  Do not sit for a long time without moving. Get up to take short walks every 1-2 hours. This is important. Ask for help if you feel weak or unsteady.  Return to your normal activities as told by your doctor. Ask your doctor what activities are safe for you. To help cramping and bloating:  Try walking around.  Put heat on your belly as told by your doctor. Use the heat source that your doctor recommends, such as a moist heat pack or a heating pad. ? Put a towel between your skin and the heat source. ? Leave the heat on for 20-30 minutes. ? Remove the heat if your skin turns bright red. This is very important if you are unable to feel pain, heat, or cold. You may have a greater risk of getting burned.   General instructions  If you were given a medicine to help you relax (sedative) during your procedure, it can affect you for many hours. Do not drive or use machinery until your doctor says that it is safe.  For the first 24 hours after the procedure: ? Do not sign important documents. ? Do  not drink alcohol. ? Do your daily activities more slowly than normal. ? Eat foods that are soft and easy to digest.  Take over-the-counter or prescription medicines only as told by your doctor.  Keep all follow-up visits as told by your doctor. This is important. Contact a doctor if:  You have blood in your poop 2-3 days after the procedure. Get help right away if:  You have more than a small amount of blood in your poop.  You see large clumps of tissue (blood clots) in your poop.  Your belly is swollen.  You feel like you may vomit (nauseous).  You vomit.  You have a fever.  You have belly pain that gets worse, and medicine does not help your pain. Summary  After the procedure, it is common to have a small amount of blood in your poop. You may also have mild cramping and bloating in your belly.  If you were given a medicine to help you relax (sedative) during your procedure, it can affect you for many hours. Do not drive or use machinery until your doctor says that it is safe.  Get help right away if you have a lot of blood in your poop, feel like you may vomit, have a fever, or have more belly pain. This information is not intended to replace advice given to you by your health care  provider. Make sure you discuss any questions you have with your health care provider. Document Revised: 07/15/2019 Document Reviewed: 04/04/2019 Elsevier Patient Education  [employer].

## 2021-02-08 ENCOUNTER — Other Ambulatory Visit (INDEPENDENT_AMBULATORY_CARE_PROVIDER_SITE_OTHER): Payer: Self-pay

## 2021-02-08 ENCOUNTER — Telehealth (INDEPENDENT_AMBULATORY_CARE_PROVIDER_SITE_OTHER): Payer: Self-pay | Admitting: *Deleted

## 2021-02-08 DIAGNOSIS — K56609 Unspecified intestinal obstruction, unspecified as to partial versus complete obstruction: Secondary | ICD-10-CM

## 2021-02-08 NOTE — Telephone Encounter (Signed)
Per op note - Will arrange for single column enema with water-soluble contrast

## 2021-02-08 NOTE — Telephone Encounter (Signed)
Kristen Mccoy is scheduled on 02/12/21 at 9:00 am

## 2021-02-12 ENCOUNTER — Ambulatory Visit (HOSPITAL_COMMUNITY): Payer: Medicare Other

## 2021-02-13 ENCOUNTER — Encounter (HOSPITAL_COMMUNITY): Payer: Self-pay | Admitting: Internal Medicine

## 2021-02-18 DIAGNOSIS — E119 Type 2 diabetes mellitus without complications: Secondary | ICD-10-CM | POA: Diagnosis not present

## 2021-02-18 DIAGNOSIS — I10 Essential (primary) hypertension: Secondary | ICD-10-CM | POA: Diagnosis not present

## 2021-02-21 ENCOUNTER — Ambulatory Visit (HOSPITAL_COMMUNITY)
Admission: RE | Admit: 2021-02-21 | Discharge: 2021-02-21 | Disposition: A | Discharge status: Home / Self Care | Payer: Medicare Other | Source: Ambulatory Visit | Attending: Internal Medicine | Admitting: Internal Medicine

## 2021-02-21 DIAGNOSIS — K56609 Unspecified intestinal obstruction, unspecified as to partial versus complete obstruction: Secondary | ICD-10-CM | POA: Diagnosis not present

## 2021-02-21 MED ORDER — IOHEXOL 300 MG/ML  SOLN
450.0000 mL | Freq: Once | INTRAMUSCULAR | Status: AC | PRN
  Administered 2021-02-21: 450 mL

## 2021-02-22 ENCOUNTER — Other Ambulatory Visit: Payer: Self-pay | Admitting: Internal Medicine

## 2021-02-22 DIAGNOSIS — Z139 Encounter for screening, unspecified: Secondary | ICD-10-CM

## 2021-02-25 ENCOUNTER — Ambulatory Visit
Admission: RE | Admit: 2021-02-25 | Discharge: 2021-02-25 | Disposition: A | Discharge status: Home / Self Care | Payer: Medicare Other | Source: Ambulatory Visit | Attending: Internal Medicine | Admitting: Internal Medicine

## 2021-02-25 ENCOUNTER — Other Ambulatory Visit: Payer: Self-pay

## 2021-02-25 DIAGNOSIS — Z139 Encounter for screening, unspecified: Secondary | ICD-10-CM

## 2021-02-25 DIAGNOSIS — Z1231 Encounter for screening mammogram for malignant neoplasm of breast: Secondary | ICD-10-CM | POA: Diagnosis not present

## 2021-04-16 DIAGNOSIS — E1142 Type 2 diabetes mellitus with diabetic polyneuropathy: Secondary | ICD-10-CM | POA: Diagnosis not present

## 2021-04-16 DIAGNOSIS — E1165 Type 2 diabetes mellitus with hyperglycemia: Secondary | ICD-10-CM | POA: Diagnosis not present

## 2021-04-16 DIAGNOSIS — I10 Essential (primary) hypertension: Secondary | ICD-10-CM | POA: Diagnosis not present

## 2021-04-16 DIAGNOSIS — Z299 Encounter for prophylactic measures, unspecified: Secondary | ICD-10-CM | POA: Diagnosis not present

## 2021-04-21 DIAGNOSIS — E7800 Pure hypercholesterolemia, unspecified: Secondary | ICD-10-CM | POA: Diagnosis not present

## 2021-04-21 DIAGNOSIS — I10 Essential (primary) hypertension: Secondary | ICD-10-CM | POA: Diagnosis not present

## 2021-05-03 DIAGNOSIS — E1165 Type 2 diabetes mellitus with hyperglycemia: Secondary | ICD-10-CM | POA: Diagnosis not present

## 2021-06-03 DIAGNOSIS — E1165 Type 2 diabetes mellitus with hyperglycemia: Secondary | ICD-10-CM | POA: Diagnosis not present

## 2021-06-21 DIAGNOSIS — I10 Essential (primary) hypertension: Secondary | ICD-10-CM | POA: Diagnosis not present

## 2021-07-04 DIAGNOSIS — E1165 Type 2 diabetes mellitus with hyperglycemia: Secondary | ICD-10-CM | POA: Diagnosis not present

## 2021-07-08 ENCOUNTER — Inpatient Hospital Stay: Payer: Medicare Other | Attending: Gynecologic Oncology | Admitting: Gynecologic Oncology

## 2021-07-08 ENCOUNTER — Encounter: Payer: Self-pay | Admitting: Gynecologic Oncology

## 2021-07-08 ENCOUNTER — Other Ambulatory Visit: Payer: Self-pay

## 2021-07-08 VITALS — BP 145/62 | HR 100 | Temp 98.0°F | Resp 18 | Ht 61.0 in | Wt 120.0 lb

## 2021-07-08 DIAGNOSIS — Z8542 Personal history of malignant neoplasm of other parts of uterus: Secondary | ICD-10-CM | POA: Diagnosis not present

## 2021-07-08 DIAGNOSIS — Z923 Personal history of irradiation: Secondary | ICD-10-CM | POA: Diagnosis not present

## 2021-07-08 DIAGNOSIS — Z9221 Personal history of antineoplastic chemotherapy: Secondary | ICD-10-CM | POA: Insufficient documentation

## 2021-07-08 DIAGNOSIS — Z90710 Acquired absence of both cervix and uterus: Secondary | ICD-10-CM | POA: Insufficient documentation

## 2021-07-08 DIAGNOSIS — Z90722 Acquired absence of ovaries, bilateral: Secondary | ICD-10-CM | POA: Insufficient documentation

## 2021-07-08 DIAGNOSIS — C541 Malignant neoplasm of endometrium: Secondary | ICD-10-CM

## 2021-07-08 NOTE — Patient Instructions (Signed)
It was great to see you today!  You are doing well.  No evidence of cancer recurrence on your exam.    We will continue with visits every 6 months until you are 5 years out from treatment.    Congratulations on being just over 3 years out from completing your treatment!  You will see Dr. Sondra Come in 6 months and come back to see me next year around this time.  Please call back sometime over the summer to get a visit scheduled with me in 06/2022

## 2021-07-08 NOTE — Progress Notes (Signed)
Gynecologic Oncology Return Clinic Visit  07/08/21  Reason for Visit: surveillance visit in the setting of high risk endometrial cancer  Treatment History: Oncology History Overview Note  MSI normal   Endometrial cancer ([name])  02/15/2018 Imaging   CT [facility] revealed endometrial mass measuring approximately 4.2 x 4.2 cm most concerning for endometrial carcinoma.   02/24/2018 Initial Biopsy   EMB - poorly differentiated adenocarcinoma Pap - adenocarcinoma   02/24/2018 Initial Diagnosis   Endometrial cancer ([name])   03/11/2018 Surgery   TRH/BSO, SLN bx, washings, pelvic LND, PA LND Dr. Gerarda Fraction  FIGO stage 2 Gr3 endometrioid adenocarcinoma, + sentinel ITC   05/06/2018 - 06/29/2018 Radiation Therapy   8/15-9/19: EBRT (pelvis), 1.8Gy x25 fxns for total of 45Gy 9/24, 10/1, and 10/8: HDR brachy, 6 Gy x3 fxns for total of 18 Gy   09/2018 Imaging   CT - NED but there is a mediastinal node with recommendations to follow in 6 months.    03/2019 Imaging   CT scan IMPRESSION: 1. Stable mediastinal and hilar lymph nodes. No new or progressive findings. 2. No pulmonary nodules to suggest pulmonary metastatic disease. No pleural effusion or pleural nodules. 3. Stable bilateral lower lobe bronchiectasis. 4. No CT findings for abdominal/pelvic adenopathy or metastatic disease.   11/30/2019 Cancer Staging   Staging form: Corpus Uteri - Carcinoma and Carcinosarcoma, AJCC 8th Edition - Clinical stage from 11/30/2019: FIGO Stage II (cT2, cN0(i+)(sn), cM0) - Signed by Kristen Mosses, MD on 11/30/2019     Interval History: Saw Dr. Sondra Come last in April and was NED at that time.  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 Mammogram updated on 02/25/21.  Reports she is doing well today.  Denies any abdominal or pelvic pain.  Reports regular bowel function without the use of medications.  Denies any urinary symptoms.  Denies any vaginal bleeding or discharge.  Past  Medical/Surgical History: Past Medical History:  Diagnosis Date   Arthritis    Diabetes ([name])     type 2   Endometrial cancer ([name]) 02/24/2018   History of radiation therapy 05/06/2018-06/29/2018   25 fx Pelvis, 3 fx HDR Vaginal Cuff; Dr. Gery Pray   Hypertension    x 5 yr   Pneumonia     Past Surgical History:  Procedure Laterality Date   ABDOMINAL HYSTERECTOMY     total hysterectomy Dr. Gerarda Fraction 03-11-18   APPENDECTOMY     Open HOWEVER incision is in RUQ   BREAST BIOPSY Right 11/30/2013   COLONOSCOPY N/A 12/14/2013   Procedure: COLONOSCOPY;  Surgeon: Rogene Houston, MD;  [facility];  Service: Endoscopy;  Laterality: N/A;  100   ESOPHAGOGASTRODUODENOSCOPY N/A 12/26/2015   Procedure: ESOPHAGOGASTRODUODENOSCOPY (EGD);  Surgeon: Rogene Houston, MD;  [facility];  Service: Endoscopy;  Laterality: N/A;  2:45   FLEXIBLE SIGMOIDOSCOPY N/A 02/07/2021   Procedure: FLEXIBLE SIGMOIDOSCOPY;  Surgeon: Rogene Houston, MD;  [facility];  Service: Endoscopy;  Laterality: N/A;  imcomplete exam due to sigmoid colon stricture    ROBOTIC ASSISTED TOTAL HYSTERECTOMY WITH BILATERAL SALPINGO OOPHERECTOMY N/A 03/11/2018   Procedure: XI ROBOTIC ASSISTED TOTAL HYSTERECTOMY WITH BILATERAL SALPINGO OOPHORECTOMY, WITH STAGING;  Surgeon: Isabel Caprice, MD;  [facility];  Service: Gynecology;  Laterality: N/A;   SENTINEL NODE BIOPSY N/A 03/11/2018   Procedure: SENTINEL NODE BIOPSY;  Surgeon: Isabel Caprice, MD;  [facility];  Service: Gynecology;  Laterality: N/A;   TUBAL LIGATION  Family History  Problem Relation Age of Onset   Diabetes Mother    Diabetes Brother    Colon cancer Neg Hx     Social History   Socioeconomic History   Marital status: Widowed    Spouse name: Not on file   Number of children: Not on file   Years of education: Not on file   Highest education level: Not on file  Occupational History   Not on file  Tobacco Use    Smoking status: Never   Smokeless tobacco: Never  Vaping Use   Vaping Use: Never used  Substance and Sexual Activity   Alcohol use: No   Drug use: No   Sexual activity: Yes  Other Topics Concern   Not on file  Social History Narrative   Not on file   Social Determinants of Health   Financial Resource Strain: Not on file  Food Insecurity: Not on file  Transportation Needs: Not on file  Physical Activity: Not on file  Stress: Not on file  Social Connections: Not on file    Current Medications:  Current Outpatient Medications:    albuterol (VENTOLIN HFA) 108 (90 Base) MCG/ACT inhaler, Inhale 1-2 puffs into the lungs every 4 (four) hours as needed for shortness of breath or wheezing., Disp: , Rfl:    Alcohol Swabs (GLOBAL ALCOHOL PREP EASE) 70 % PADS, Apply 1 each topically daily., Disp: , Rfl:    amLODipine (NORVASC) 5 MG tablet, Take 5 mg by mouth in the morning., Disp: , Rfl:    atorvastatin (LIPITOR) 20 MG tablet, Take 20 mg by mouth in the morning., Disp: , Rfl:    Blood Glucose Monitoring Suppl (ACCU-CHEK GUIDE) w/Device KIT, USE 1 ONCE DAILY, Disp: , Rfl:    dorzolamide (TRUSOPT) 2 % ophthalmic solution, Place 1 drop into both eyes in the morning and at bedtime., Disp: , Rfl:    hydrochlorothiazide (HYDRODIURIL) 25 MG tablet, Take 25 mg by mouth in the morning., Disp: , Rfl:    Lancets MISC, SMARTSIG:1 Lancet(s) Topical Twice Daily, Disp: , Rfl:    metFORMIN (GLUCOPHAGE) 500 MG tablet, Take 1,000 mg by mouth 2 (two) times daily with a meal., Disp: , Rfl:    sitaGLIPtin (JANUVIA) 50 MG tablet, Take 50 mg by mouth in the morning., Disp: , Rfl:    Travoprost, BAK Free, (TRAVATAN) 0.004 % SOLN ophthalmic solution, Place 1 drop into both eyes at bedtime., Disp: , Rfl:    Cholecalciferol (VITAMIN D-3) 125 MCG (5000 UT) TABS, Take 5,000 Units by mouth in the morning. (Patient not taking: Reported on 07/05/2021), Disp: , Rfl:    tiZANidine (ZANAFLEX) 4 MG tablet, Take 4 mg by mouth  daily as needed for muscle spasms. (Patient not taking: Reported on 07/05/2021), Disp: , Rfl:    vitamin C (ASCORBIC ACID) 500 MG tablet, Take 500 mg by mouth in the morning. (Patient not taking: Reported on 07/05/2021), Disp: , Rfl:   Review of Systems: Denies appetite changes, fevers, chills, fatigue, unexplained weight changes. Denies hearing loss, neck lumps or masses, mouth sores, ringing in ears or voice changes. Denies cough or wheezing.  Denies shortness of breath. Denies chest pain or palpitations. Denies leg swelling. Denies abdominal distention, pain, blood in stools, constipation, diarrhea, nausea, vomiting, or early satiety. Denies pain with intercourse, dysuria, frequency, hematuria or incontinence. Denies hot flashes, pelvic pain, vaginal bleeding or vaginal discharge.   Denies joint pain, back pain or muscle pain/cramps. Denies itching, rash, or wounds. Denies  dizziness, headaches, numbness or seizures. Denies swollen lymph nodes or glands, denies easy bruising or bleeding. Denies anxiety, depression, confusion, or decreased concentration.  Physical Exam: BP (!) 145/62 (BP Location: Left Arm, Patient Position: Sitting)   Pulse 100   Temp 98 F (36.7 C) (Oral)   Resp 18   Ht _0  (1.549 m)   Wt 120 lb (54.4 kg)   SpO2 99%   BMI 22.67 kg/m  General: Alert, oriented, no acute distress. HEENT: Normocephalic, atraumatic, sclera anicteric. Chest: Clear to auscultation bilaterally.  No wheezes or rhonchi. Cardiovascular: Regular rate and rhythm, no murmurs. Abdomen: soft, nontender.  Normoactive bowel sounds.  No masses or hepatosplenomegaly appreciated.  Well-healed incisions. Extremities: Grossly normal range of motion.  Warm, well perfused.  No edema bilaterally. Skin: No rashes or lesions noted. Lymphatics: No cervical, supraclavicular, or inguinal adenopathy. GU: Normal appearing external genitalia without erythema, excoriation, or lesions.  Speculum exam reveals  moderately atrophic vaginal mucosa with radiation changes noted.  No bleeding or discharge, no masses.  Bimanual exam reveals cuff intact, no nodularity or masses.  Rectovaginal exam confirms these findings.  Laboratory & Radiologic Studies: None new  Assessment & Plan: Kristen Mccoy is a 80 y.o. 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   The patient is overall doing well and NED on exam today.  She is now 3 years out from completion of adjuvant radiation. Per [facility] surveillance recommendations, we will continue q 6 month visits, alternating between myself and Dr. Sondra Come.  We reviewed signs and symptoms that would be worrisome for disease recurrence and should prompt a phone call to be seen sooner.   32 minutes of total time was spent for this patient encounter, including preparation, face-to-face counseling with the patient and coordination of care, and documentation of the encounter.  Jeral Pinch, MD  Division of Gynecologic Oncology  Department of Obstetrics and Gynecology  [facility]

## 2021-07-11 DIAGNOSIS — H401231 Low-tension glaucoma, bilateral, mild stage: Secondary | ICD-10-CM | POA: Diagnosis not present

## 2021-07-11 DIAGNOSIS — H524 Presbyopia: Secondary | ICD-10-CM | POA: Diagnosis not present

## 2021-07-22 DIAGNOSIS — E1142 Type 2 diabetes mellitus with diabetic polyneuropathy: Secondary | ICD-10-CM | POA: Diagnosis not present

## 2021-07-22 DIAGNOSIS — E1165 Type 2 diabetes mellitus with hyperglycemia: Secondary | ICD-10-CM | POA: Diagnosis not present

## 2021-07-22 DIAGNOSIS — Z6824 Body mass index (BMI) 24.0-24.9, adult: Secondary | ICD-10-CM | POA: Diagnosis not present

## 2021-07-22 DIAGNOSIS — I10 Essential (primary) hypertension: Secondary | ICD-10-CM | POA: Diagnosis not present

## 2021-07-22 DIAGNOSIS — Z299 Encounter for prophylactic measures, unspecified: Secondary | ICD-10-CM | POA: Diagnosis not present

## 2021-08-05 DIAGNOSIS — E1165 Type 2 diabetes mellitus with hyperglycemia: Secondary | ICD-10-CM | POA: Diagnosis not present

## 2021-08-26 DIAGNOSIS — H401111 Primary open-angle glaucoma, right eye, mild stage: Secondary | ICD-10-CM | POA: Diagnosis not present

## 2021-09-04 DIAGNOSIS — E1165 Type 2 diabetes mellitus with hyperglycemia: Secondary | ICD-10-CM | POA: Diagnosis not present

## 2021-10-03 IMAGING — MG MM DIGITAL SCREENING BILAT W/ TOMO AND CAD
6 of 10 series · 6 of 30 positions shown · non-contrast
Comparison: Previous exam(s).

CLINICAL DATA: Screening.

EXAM:
DIGITAL SCREENING BILATERAL MAMMOGRAM WITH TOMOSYNTHESIS AND CAD
TECHNIQUE: Bilateral screening digital craniocaudal and mediolateral oblique
mammograms were obtained. Bilateral screening digital breast
tomosynthesis was performed. The images were evaluated with
computer-aided detection.

[L CC synth-2D]
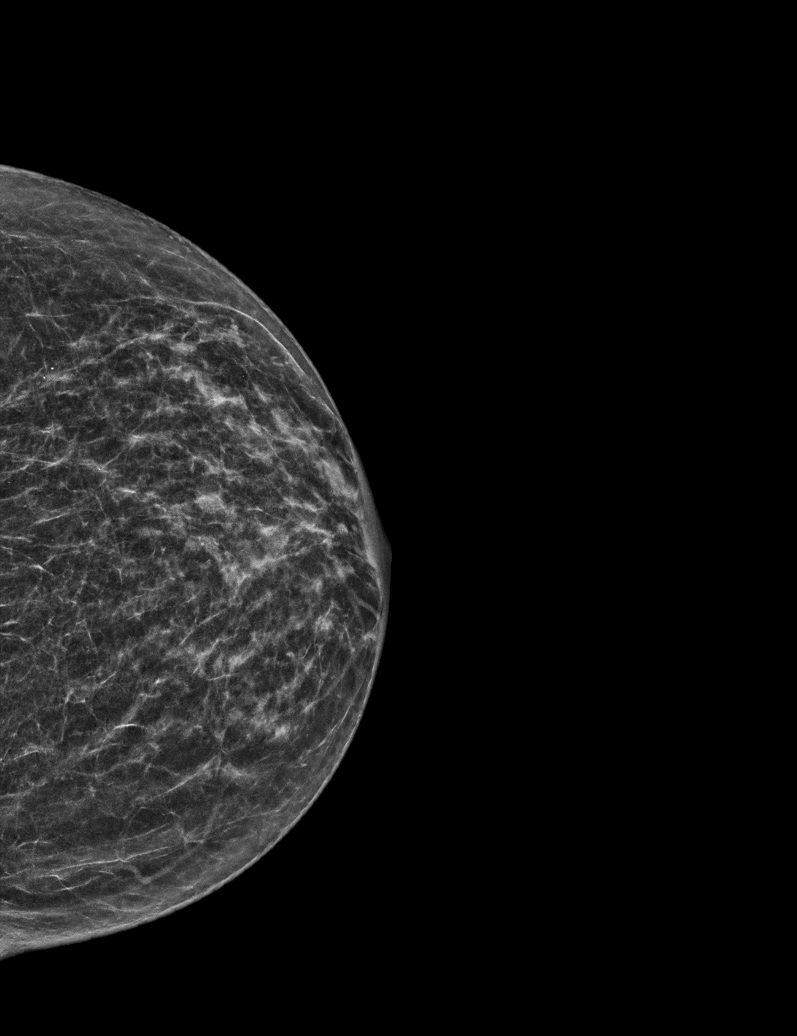

[R MLO synth-2D]
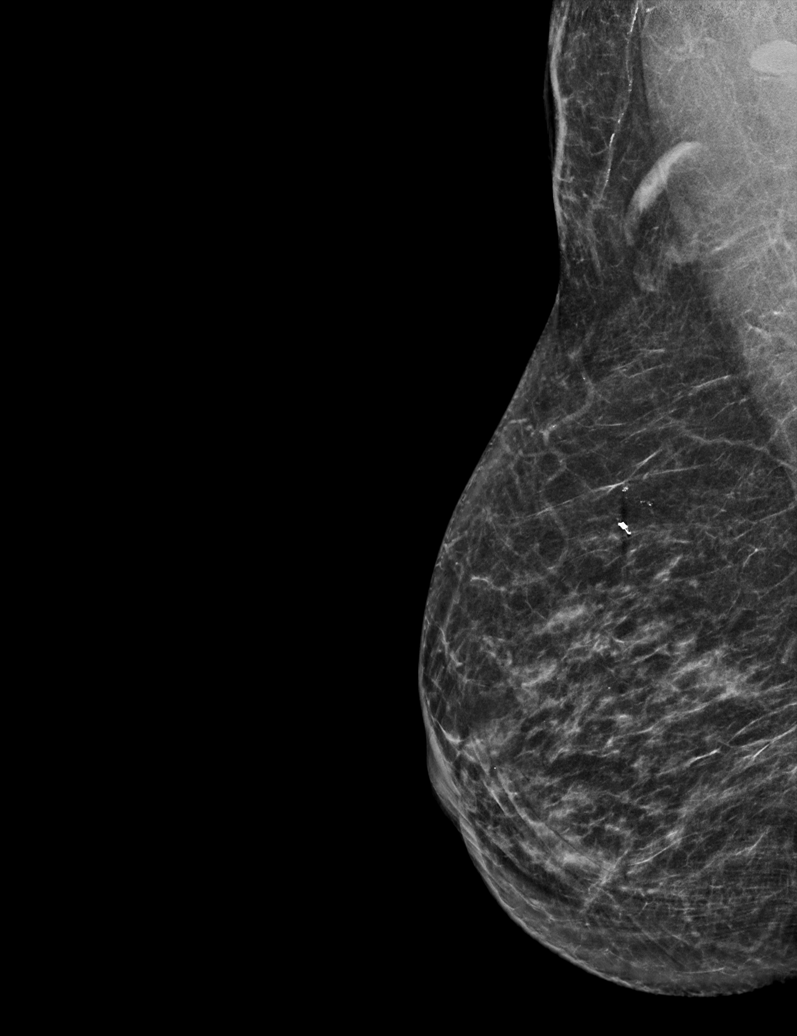

[R CC synth-2D]
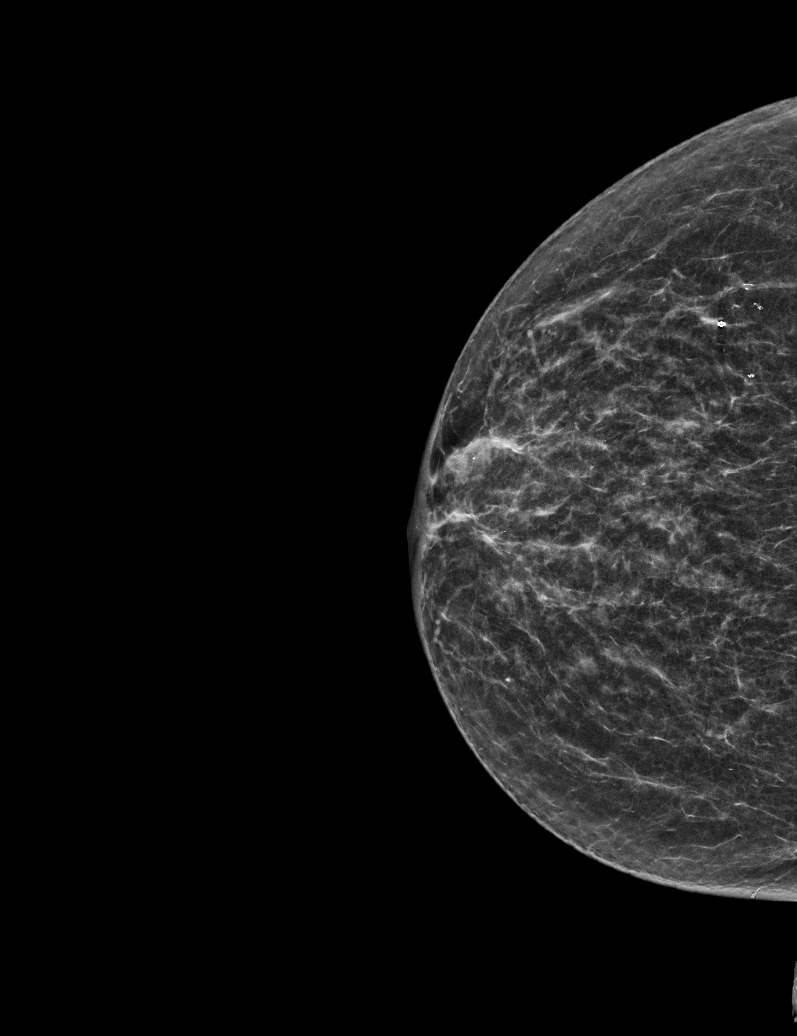

[L MLO synth-2D]
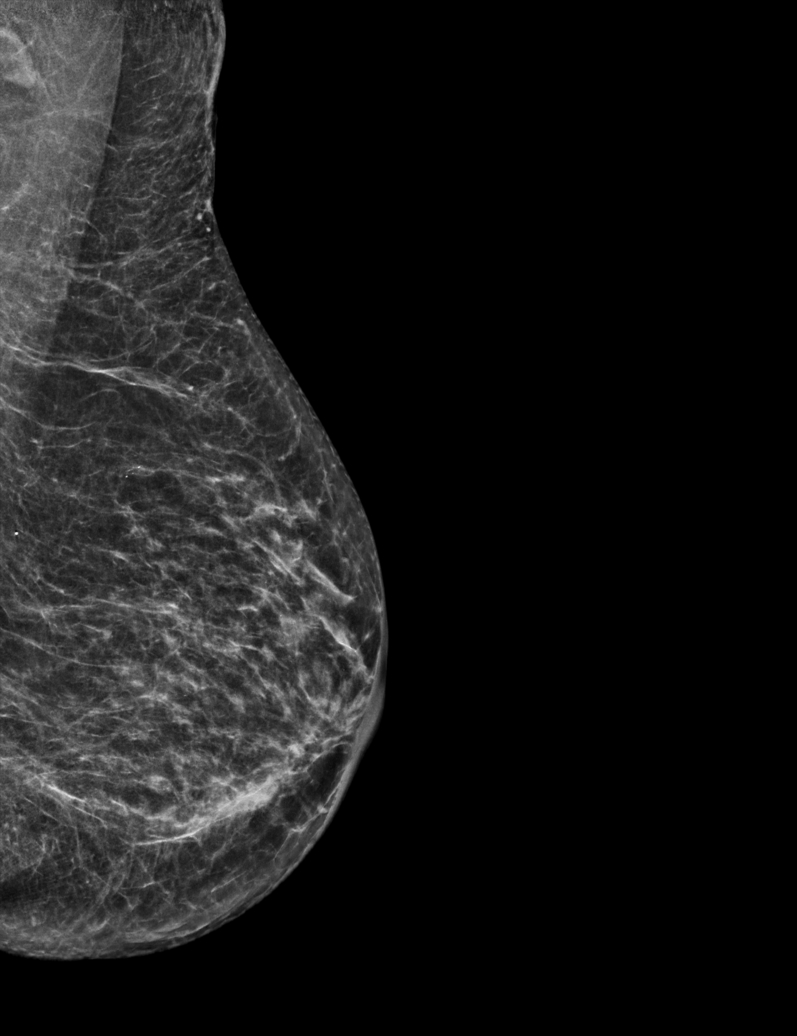

[R XCCL synth-2D]
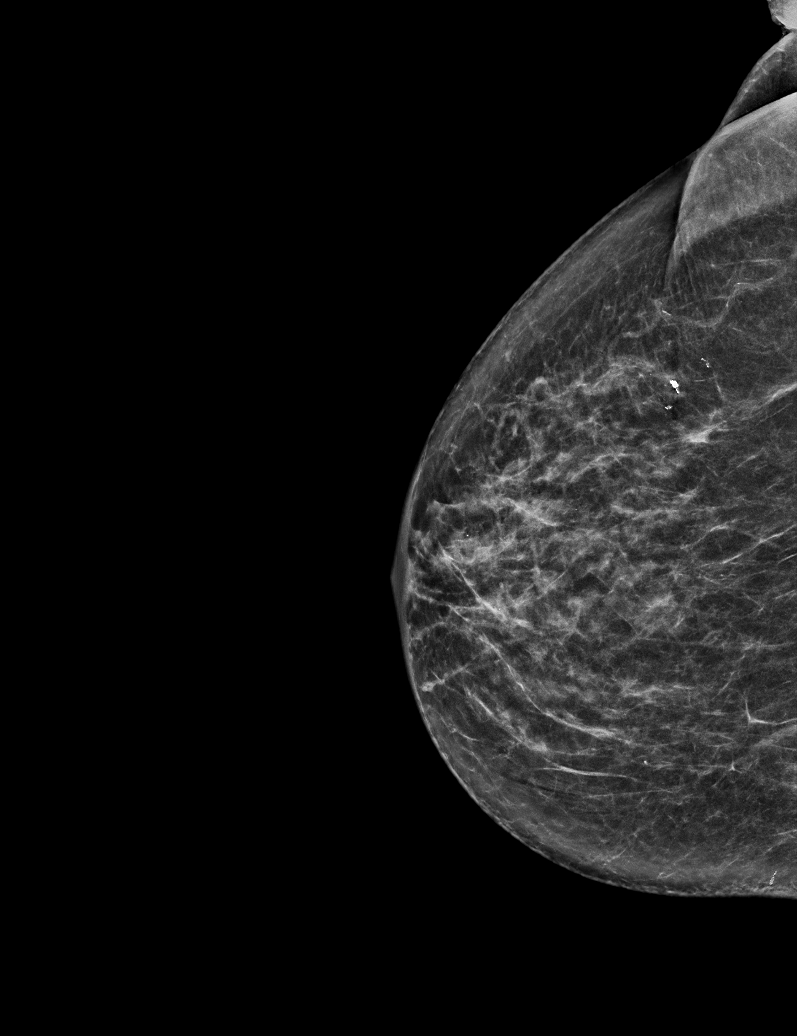

[R XCCL tomo · tomo slice 29/56.0]
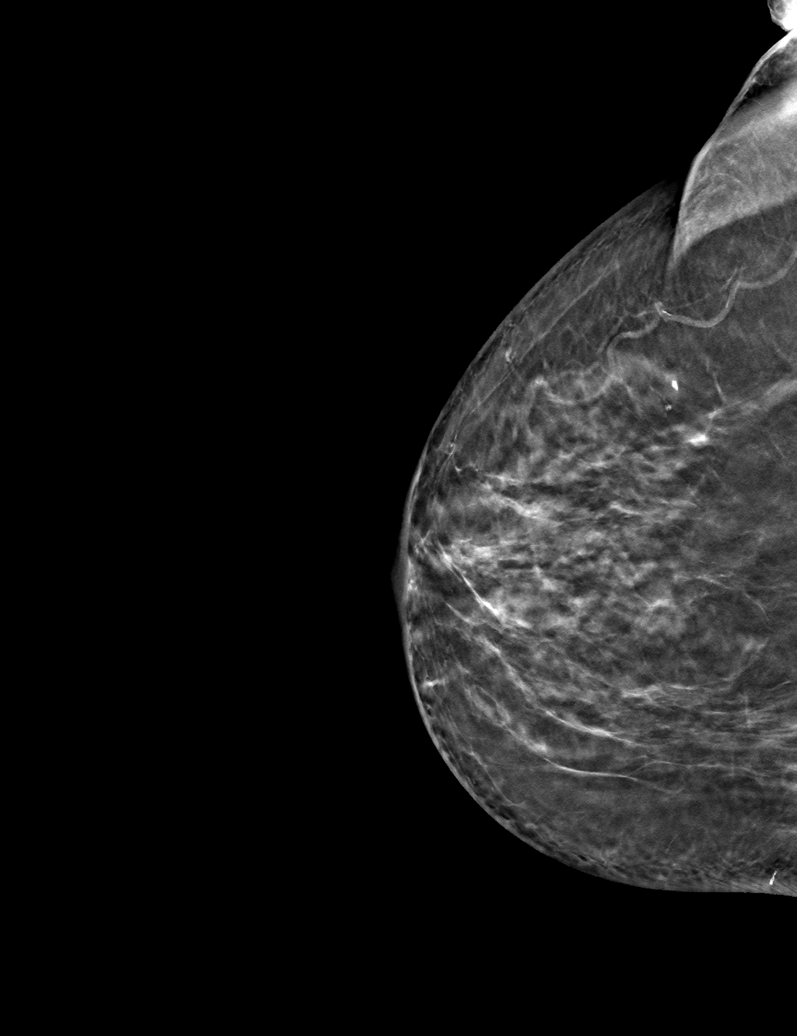

[6 of 30 positions shown; findings below may reference images not displayed]

ACR Breast Density Category b: There are scattered areas of
fibroglandular density.
FINDINGS: There are no findings suspicious for malignancy. The images were
evaluated with computer-aided detection.
IMPRESSION: No mammographic evidence of malignancy. A result letter of this
screening mammogram will be mailed directly to the patient.

RECOMMENDATION:
Screening mammogram in one year. (Code:WJ-I-BG6)

BI-RADS CATEGORY  1: Negative.

## 2021-10-25 DIAGNOSIS — Z789 Other specified health status: Secondary | ICD-10-CM | POA: Diagnosis not present

## 2021-10-25 DIAGNOSIS — E1142 Type 2 diabetes mellitus with diabetic polyneuropathy: Secondary | ICD-10-CM | POA: Diagnosis not present

## 2021-10-25 DIAGNOSIS — Z6824 Body mass index (BMI) 24.0-24.9, adult: Secondary | ICD-10-CM | POA: Diagnosis not present

## 2021-10-25 DIAGNOSIS — Z299 Encounter for prophylactic measures, unspecified: Secondary | ICD-10-CM | POA: Diagnosis not present

## 2021-10-25 DIAGNOSIS — I10 Essential (primary) hypertension: Secondary | ICD-10-CM | POA: Diagnosis not present

## 2021-10-25 DIAGNOSIS — R809 Proteinuria, unspecified: Secondary | ICD-10-CM | POA: Diagnosis not present

## 2021-10-25 DIAGNOSIS — E1129 Type 2 diabetes mellitus with other diabetic kidney complication: Secondary | ICD-10-CM | POA: Diagnosis not present

## 2021-11-25 DIAGNOSIS — H01002 Unspecified blepharitis right lower eyelid: Secondary | ICD-10-CM | POA: Diagnosis not present

## 2021-11-25 DIAGNOSIS — H25813 Combined forms of age-related cataract, bilateral: Secondary | ICD-10-CM | POA: Diagnosis not present

## 2021-11-25 DIAGNOSIS — H01001 Unspecified blepharitis right upper eyelid: Secondary | ICD-10-CM | POA: Diagnosis not present

## 2021-11-25 DIAGNOSIS — H401131 Primary open-angle glaucoma, bilateral, mild stage: Secondary | ICD-10-CM | POA: Diagnosis not present

## 2021-11-29 DIAGNOSIS — Z79899 Other long term (current) drug therapy: Secondary | ICD-10-CM | POA: Diagnosis not present

## 2021-11-29 DIAGNOSIS — I10 Essential (primary) hypertension: Secondary | ICD-10-CM | POA: Diagnosis not present

## 2021-11-29 DIAGNOSIS — E7800 Pure hypercholesterolemia, unspecified: Secondary | ICD-10-CM | POA: Diagnosis not present

## 2021-11-29 DIAGNOSIS — Z299 Encounter for prophylactic measures, unspecified: Secondary | ICD-10-CM | POA: Diagnosis not present

## 2021-11-29 DIAGNOSIS — E559 Vitamin D deficiency, unspecified: Secondary | ICD-10-CM | POA: Diagnosis not present

## 2021-11-29 DIAGNOSIS — Z6823 Body mass index (BMI) 23.0-23.9, adult: Secondary | ICD-10-CM | POA: Diagnosis not present

## 2021-11-29 DIAGNOSIS — Z0000 Encounter for general adult medical examination without abnormal findings: Secondary | ICD-10-CM | POA: Diagnosis not present

## 2021-11-29 DIAGNOSIS — R5383 Other fatigue: Secondary | ICD-10-CM | POA: Diagnosis not present

## 2021-11-29 DIAGNOSIS — Z7189 Other specified counseling: Secondary | ICD-10-CM | POA: Diagnosis not present

## 2021-12-05 ENCOUNTER — Other Ambulatory Visit: Payer: Self-pay | Admitting: Internal Medicine

## 2021-12-05 DIAGNOSIS — Z1231 Encounter for screening mammogram for malignant neoplasm of breast: Secondary | ICD-10-CM

## 2021-12-19 DIAGNOSIS — I10 Essential (primary) hypertension: Secondary | ICD-10-CM | POA: Diagnosis not present

## 2022-01-02 DIAGNOSIS — H25812 Combined forms of age-related cataract, left eye: Secondary | ICD-10-CM | POA: Diagnosis not present

## 2022-01-05 NOTE — Progress Notes (Signed)
?Radiation Oncology         [phone] ?________________________________ ? ?Name: Kristen Mccoy MRN: [id number]  ?Date: 01/06/2022  DOB: [birth year] ? ?Follow-Up Visit Note ? ?CC: Monico Blitz, MD  Isabel Caprice, MD ? ?  ICD-10-CM   ?1. Endometrial cancer ([place])  U93.2 Basic Metabolic Panel - [place] Only  ?  CT Abdomen Pelvis W Contrast  ?  ? ? ?Diagnosis: Stage II, grade 3 endometrial cancer with isolated tumor cells on sentinel node [pT2,pN0(i+)]   ? ?Interval Since Last Radiation: 3 years, 6 months, and 9 days ? ?Radiation treatment dates:  05/06/2018 - 06/10/2018; ?HDR: 06/15/2018, 06/22/2018, and 06/29/2018 ?  ?Site/dose:  1. Pelvis / 1.8 Gy x 25 fractions for a total dose of 45 Gy ?2. Vaginal cuff / 6 Gy x 3 fractions for a total dose of 18 Gy ?  ?Narrative:  The patient returns today for routine annual follow-up, she was last seen here for follow-up on 01/03/21. Since her last visit, the patient followed up with Dr. Berline Lopes on 07/08/22. During which time, the patient denied any symptoms concerning for disease recurrence an was noted as NED on examination.             ? ?Pertinent imaging performed in the interval includes:  ?-- Bilateral screening mammogram on 02/25/21 which showed no evidence of malignancy in either breast.              ? ?Of note: the patient had a colonoscopy performed on 02/07/21 due to her history of colonic polyps which revealed stricture in the mid sigmoid colon. ? ?On evaluation today the patient is reporting pain in the right pelvis with some radiation into the right upper leg.  This began approximately 8 to 10 weeks ago and she rates this pain is approximately 10 out of 10 on occasions.  She denies any vaginal bleeding or discharge.  She denies any abdominal bloating. ? ?Allergies:  is allergic to aspirin and penicillins. ? ?Meds: ?Current Outpatient Medications  ?Medication Sig Dispense Refill  ? albuterol (VENTOLIN HFA) 108 (90 Base) MCG/ACT inhaler Inhale 1-2 puffs  into the lungs every 4 (four) hours as needed for shortness of breath or wheezing.    ? Alcohol Swabs (GLOBAL ALCOHOL PREP EASE) 70 % PADS Apply 1 each topically daily.    ? amLODipine (NORVASC) 5 MG tablet Take 5 mg by mouth in the morning.    ? atorvastatin (LIPITOR) 20 MG tablet Take 20 mg by mouth in the morning.    ? Blood Glucose Monitoring Suppl (ACCU-CHEK GUIDE) w/Device KIT USE 1 ONCE DAILY    ? Cholecalciferol (VITAMIN D-3) 125 MCG (5000 UT) TABS Take 5,000 Units by mouth in the morning.    ? dorzolamide (TRUSOPT) 2 % ophthalmic solution Place 1 drop into both eyes in the morning and at bedtime.    ? hydrochlorothiazide (HYDRODIURIL) 25 MG tablet Take 25 mg by mouth in the morning.    ? Lancets MISC SMARTSIG:1 Lancet(s) Topical Twice Daily    ? metFORMIN (GLUCOPHAGE) 500 MG tablet Take 1,000 mg by mouth 2 (two) times daily with a meal.    ? MYRBETRIQ 50 MG TB24 tablet Take 50 mg by mouth daily.    ? sitaGLIPtin (JANUVIA) 50 MG tablet Take 50 mg by mouth in the morning.    ? tiZANidine (ZANAFLEX) 4 MG tablet Take 4 mg by mouth daily as needed for muscle spasms.    ? Travoprost, BAK Free, (  TRAVATAN) 0.004 % SOLN ophthalmic solution Place 1 drop into both eyes at bedtime.    ? vitamin C (ASCORBIC ACID) 500 MG tablet Take 500 mg by mouth in the morning.    ? ?No current facility-administered medications for this encounter.  ? ? ?Physical Findings: ?The patient is in no acute distress. Patient is alert and oriented. ? height is 5' 1" (1.549 m) and weight is 118 lb (53.5 kg). Her temporal temperature is 97.1 ?F (36.2 ?C) (abnormal). Her blood pressure is 140/65 and her pulse is 88. Her respiration is 18 and oxygen saturation is 100%. .  Lungs are clear to auscultation bilaterally. Heart has regular rate and rhythm. No palpable cervical, supraclavicular, or axillary adenopathy. Abdomen soft, non-tender, normal bowel sounds. ? ?On pelvic examination the external genitalia were unremarkable. A speculum exam was  performed. There are no mucosal lesions noted in the vaginal vault.  On bimanual rectovaginal examination there were no pelvic masses appreciated.  Vaginal cuff intact.  Rectal sphincter tone decreased. ? ?Lab Findings: ?Lab Results  ?Component Value Date  ? WBC 10.0 03/12/2018  ? HGB 10.4 (L) 03/12/2018  ? HCT 33.0 (L) 03/12/2018  ? MCV 79.1 03/12/2018  ? PLT 244 03/12/2018  ? ? ?Radiographic Findings: ?No results found. ? ?Impression:  Stage II, grade 3 endometrial cancer with isolated tumor cells on sentinel node [pT2,pN0(i+)]   ? ?No evidence of recurrence on clinical exam today.  She has recently developed new onset pain in the right pelvis region.  Given this issue she will proceed with CT scan of the abdomen and pelvis for further evaluation.  If this scan shows no evidence of recurrence then I have recommended she consult with her primary care physician to further evaluate this issue. ? ?Plan: She will proceed with CT scan of the abdomen and pelvis.  If these are without recurrence then the patient will see Dr. Berline Lopes in 6 months and then follow-up in radiation oncology in 1 year. ? ? ?25 minutes of total time was spent for this patient encounter, including preparation, face-to-face counseling with the patient and coordination of care, physical exam, and documentation of the encounter. ?____________________________________ ? ?Blair Promise, PhD, MD ? ?This document serves as a record of services personally performed by Gery Pray, MD. It was created on his behalf by Roney Mans, a trained medical scribe. The creation of this record is based on the scribe's personal observations and the provider's statements to them. This document has been checked and approved by the attending provider. ?_0 ?

## 2022-01-06 ENCOUNTER — Encounter (HOSPITAL_COMMUNITY): Payer: Self-pay

## 2022-01-06 ENCOUNTER — Ambulatory Visit
Admission: RE | Admit: 2022-01-06 | Discharge: 2022-01-06 | Disposition: A | Discharge status: Home / Self Care | Payer: Medicare Other | Source: Ambulatory Visit | Attending: Radiation Oncology | Admitting: Radiation Oncology

## 2022-01-06 ENCOUNTER — Encounter (HOSPITAL_COMMUNITY)
Admission: RE | Admit: 2022-01-06 | Discharge: 2022-01-06 | Disposition: A | Discharge status: Home / Self Care | Payer: Medicare Other | Source: Ambulatory Visit | Attending: Ophthalmology | Admitting: Ophthalmology

## 2022-01-06 ENCOUNTER — Other Ambulatory Visit: Payer: Self-pay

## 2022-01-06 ENCOUNTER — Encounter: Payer: Self-pay | Admitting: Radiation Oncology

## 2022-01-06 VITALS — BP 140/65 | HR 88 | Temp 97.1°F | Resp 18 | Ht 61.0 in | Wt 118.0 lb

## 2022-01-06 DIAGNOSIS — Z79899 Other long term (current) drug therapy: Secondary | ICD-10-CM | POA: Insufficient documentation

## 2022-01-06 DIAGNOSIS — C541 Malignant neoplasm of endometrium: Secondary | ICD-10-CM | POA: Insufficient documentation

## 2022-01-06 DIAGNOSIS — Z8719 Personal history of other diseases of the digestive system: Secondary | ICD-10-CM | POA: Insufficient documentation

## 2022-01-06 LAB — BASIC METABOLIC PANEL - CANCER CENTER ONLY
Anion gap: 6 (ref 5–15)
BUN: 17 mg/dL (ref 8–23)
CO2: 30 mmol/L (ref 22–32)
Calcium: 9.5 mg/dL (ref 8.9–10.3)
Chloride: 105 mmol/L (ref 98–111)
Creatinine: 0.88 mg/dL (ref 0.44–1.00)
GFR, Estimated: >60 mL/min (ref >60)
Glucose, Bld: 111 mg/dL — ABNORMAL HIGH (ref 70–99)
Potassium: 4.3 mmol/L (ref 3.5–5.1)
Sodium: 141 mmol/L (ref 135–145)

## 2022-01-06 NOTE — Progress Notes (Addendum)
Kristen Mccoy is here today for follow up post radiation to the pelvic. ? ?They completed their radiation on: 06/29/2018  ? ?Does the patient complain of any of the following: ? ?Pain:10/10 right hip/upper leg pain ?Abdominal bloating: denies ?Diarrhea/Constipation: reports loose stools ?Nausea/Vomiting: denies ?Vaginal Discharge: denies ?Blood in Urine or Stool: denies ?Urinary Issues (dysuria/incomplete emptying/ incontinence/ increased frequency/urgency): frequency, urgency, nocturia x2 ?Does patient report using vaginal dilator 2-3 times a week and/or sexually active 2-3 weeks: occasionally ?Post radiation skin changes: denies ? ? ?Additional comments if applicable: nothing of note ? ?Vitals:  ? 01/06/22 1104  ?BP: 140/65  ?Pulse: 88  ?Resp: 18  ?Temp: (!) 97.1 ?F (36.2 ?C)  ?TempSrc: Temporal  ?SpO2: 100%  ?Weight: 118 lb (53.5 kg)  ?Height: '5\' 1"'$  (1.549 m)  ? ? ?

## 2022-01-08 NOTE — H&P (Addendum)
Surgical History & Physical ? ?Patient Name: Kristen Mccoy DOB: 06-06-1941 ? ?Surgery: Cataract extraction with intraocular lens implant phacoemulsification & Goniotomy Treatment (iAccess); Left Eye ? ?Surgeon: Baruch Goldmann MD ?Surgery Date:  01-10-22 ?Pre-Op Date:  01-02-22 ? ?HPI: ?A 47 Yr. old female patient is referred by Dr Rosana Hoes for cataract eval. 1. The patient complains of poor night vision, which began 1 year ago. Both eyes are affected. The episode is gradual. The condition's severity increased since last visit. Symptoms occur when the patient is driving, inside and outside. The complaint is associated with glare. This is negatively affecting the patient's quality of life and the patient is unable to function adequately in life with the current level of vision. Patient also is being treated for glaucoma. Using drops as directed. Patient states that sometimes it feels like there is something in her eyes. HPI was performed by Baruch Goldmann . ? 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 ?Review of Systems ?Negative Allergic/Immunologic ?Negative Cardiovascular ?Negative Constitutional ?Negative Ear, Nose, Mouth & Throat ?Negative Endocrine ?Negative Eyes ?Negative Gastrointestinal ?Negative Genitourinary ?Negative Hemotologic/Lymphatic ?Negative Integumentary ?Negative Musculoskeletal ?Negative Neurological ?Negative Psychiatry ?Negative Respiratory ? ?Social ?  Never smoked  ? ?Medication ?Travatan Z, Simbrinza,  ?Januvia, Metformin, Vitamin D3,  ? ?Sx/Procedures ?Stomach cancer removed,  ? ?Drug Allergies  ?Peniciilin, Aspirin, Lisinopril,  ? ?History & Physical: ?Heent: Cataract, Left Eye; Glaucoma, Left Eye ?NECK: supple without bruits ?LUNGS: lungs clear to auscultation ?CV: regular rate and rhythm ?Abdomen: soft and non-tender ? ?Impression & Plan: ?Assessment: ?1.  COMBINED  FORMS AGE RELATED CATARACT; Both Eyes (H25.813) ?2.  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  BLEPHARITIS; Right Upper Lid, Right Lower Lid, Left Upper Lid, Left Lower Lid (H01.001, H01.002,H01.004,H01.005) ?4.  Pinguecula; Both Eyes (H11.153) ?5.  EXPOSURE KERATOCONJUNCTIVITIS; Both Eyes (V42.595) ?6.  ASTIGMATISM, REGULAR; Both Eyes (H52.223) ? ?Plan: 1.  Cataract accounts for the patient's decreased vision. This visual impairment is not correctable with a tolerable change in glasses or contact lenses. Cataract surgery with an implantation of a new lens should significantly improve the visual and functional status of the patient. Discussed all risks, benefits, alternatives, and potential complications. Discussed the procedures and recovery. Patient desires to have surgery. A-scan ordered and performed today for intra-ocular lens calculations. The surgery will be performed in order to improve vision for driving, reading, and for eye examinations. Recommend phacoemulsification with intra-ocular lens. Recommend Dextenza for post-operative pain and inflammation. ?Left Eye worse - first. ?Dilates poorly - shugarcaine by protocol. ?Malyugin Ring. ?Omidira. ?Recommend iAccess to control pressure in setting of glaucoma. ?Consider Toric IOL ? ?2.  Currently on multiple medications. ?Causing keratitis. ?IOLs at goal today. ?Continue Travatan Z and [place]. ?Recommend iAccess goniotomy both eyes at the time of cataract surgery to improve compliance and help lower eye pressure as well as to improve the ocular surface. ? ?3.  Recommend regular lid cleaning. ? ?4.  Observe; Artificial tears as needed for irritation. ? ?5.  OS worse. ?Start Artificial tears 1 drop 3x/day. ? ?6.  recommend toric IOL OU. ?

## 2022-01-10 ENCOUNTER — Ambulatory Visit (HOSPITAL_COMMUNITY): Payer: Medicare Other | Admitting: Anesthesiology

## 2022-01-10 ENCOUNTER — Ambulatory Visit (HOSPITAL_COMMUNITY)
Admission: RE | Admit: 2022-01-10 | Discharge: 2022-01-10 | Disposition: A | Discharge status: Home / Self Care | Payer: Medicare Other | Attending: Ophthalmology | Admitting: Ophthalmology

## 2022-01-10 ENCOUNTER — Encounter (HOSPITAL_COMMUNITY)
Admission: RE | Disposition: A | Discharge status: Home / Self Care | Payer: Self-pay | Source: Home / Self Care | Attending: Ophthalmology

## 2022-01-10 ENCOUNTER — Encounter (HOSPITAL_COMMUNITY): Payer: Self-pay | Admitting: Ophthalmology

## 2022-01-10 ENCOUNTER — Ambulatory Visit (HOSPITAL_BASED_OUTPATIENT_CLINIC_OR_DEPARTMENT_OTHER): Payer: Medicare Other | Admitting: Anesthesiology

## 2022-01-10 DIAGNOSIS — I10 Essential (primary) hypertension: Secondary | ICD-10-CM

## 2022-01-10 DIAGNOSIS — E119 Type 2 diabetes mellitus without complications: Secondary | ICD-10-CM | POA: Diagnosis not present

## 2022-01-10 DIAGNOSIS — H401121 Primary open-angle glaucoma, left eye, mild stage: Secondary | ICD-10-CM

## 2022-01-10 DIAGNOSIS — H0100A Unspecified blepharitis right eye, upper and lower eyelids: Secondary | ICD-10-CM | POA: Diagnosis not present

## 2022-01-10 DIAGNOSIS — H0100B Unspecified blepharitis left eye, upper and lower eyelids: Secondary | ICD-10-CM | POA: Diagnosis not present

## 2022-01-10 DIAGNOSIS — H52223 Regular astigmatism, bilateral: Secondary | ICD-10-CM | POA: Insufficient documentation

## 2022-01-10 DIAGNOSIS — H42 Glaucoma in diseases classified elsewhere: Secondary | ICD-10-CM | POA: Diagnosis not present

## 2022-01-10 DIAGNOSIS — H25812 Combined forms of age-related cataract, left eye: Secondary | ICD-10-CM | POA: Diagnosis not present

## 2022-01-10 DIAGNOSIS — H2512 Age-related nuclear cataract, left eye: Secondary | ICD-10-CM | POA: Diagnosis not present

## 2022-01-10 DIAGNOSIS — E1136 Type 2 diabetes mellitus with diabetic cataract: Secondary | ICD-10-CM | POA: Insufficient documentation

## 2022-01-10 DIAGNOSIS — Z79899 Other long term (current) drug therapy: Secondary | ICD-10-CM | POA: Insufficient documentation

## 2022-01-10 DIAGNOSIS — E1139 Type 2 diabetes mellitus with other diabetic ophthalmic complication: Secondary | ICD-10-CM | POA: Diagnosis not present

## 2022-01-10 DIAGNOSIS — Z7984 Long term (current) use of oral hypoglycemic drugs: Secondary | ICD-10-CM

## 2022-01-10 HISTORY — PX: CATARACT EXTRACTION W/PHACO: SHX586

## 2022-01-10 LAB — GLUCOSE, CAPILLARY: Glucose-Capillary: 86 mg/dL (ref 70–99)

## 2022-01-10 SURGERY — PHACOEMULSIFICATION, CATARACT, WITH IOL INSERTION
Anesthesia: Monitor Anesthesia Care | Site: Eye | Laterality: Left

## 2022-01-10 MED ORDER — NEOMYCIN-POLYMYXIN-DEXAMETH 3.5-10000-0.1 OP SUSP
OPHTHALMIC | Status: DC | PRN
Start: 2022-01-10 — End: 2022-01-10
  Administered 2022-01-10: 2 [drp] via OPHTHALMIC

## 2022-01-10 MED ORDER — POVIDONE-IODINE 5 % OP SOLN
OPHTHALMIC | Status: DC | PRN
Start: 2022-01-10 — End: 2022-01-10
  Administered 2022-01-10: 1 via OPHTHALMIC

## 2022-01-10 MED ORDER — TROPICAMIDE 1 % OP SOLN
1.0000 [drp] | OPHTHALMIC | Status: AC | PRN
  Administered 2022-01-10 (×3): 1 [drp] via OPHTHALMIC

## 2022-01-10 MED ORDER — LIDOCAINE HCL 3.5 % OP GEL
1.0000 "application " | Freq: Once | OPHTHALMIC | Status: AC
  Administered 2022-01-10: 1 via OPHTHALMIC

## 2022-01-10 MED ORDER — LIDOCAINE HCL (PF) 1 % IJ SOLN
INTRAOCULAR | Status: DC | PRN
  Administered 2022-01-10: 1 mL via OPHTHALMIC

## 2022-01-10 MED ORDER — SODIUM HYALURONATE 23MG/ML IO SOSY
PREFILLED_SYRINGE | INTRAOCULAR | Status: DC | PRN
Start: 2022-01-10 — End: 2022-01-10
  Administered 2022-01-10: 0.6 mL via INTRAOCULAR

## 2022-01-10 MED ORDER — PHENYLEPHRINE HCL 2.5 % OP SOLN
1.0000 [drp] | OPHTHALMIC | Status: AC | PRN
  Administered 2022-01-10 (×3): 1 [drp] via OPHTHALMIC

## 2022-01-10 MED ORDER — STERILE WATER FOR IRRIGATION IR SOLN
Status: DC | PRN
  Administered 2022-01-10: 250 mL

## 2022-01-10 MED ORDER — TETRACAINE HCL 0.5 % OP SOLN
1.0000 [drp] | OPHTHALMIC | Status: AC | PRN
  Administered 2022-01-10 (×3): 1 [drp] via OPHTHALMIC

## 2022-01-10 MED ORDER — PHENYLEPHRINE-KETOROLAC 1-0.3 % IO SOLN
INTRAOCULAR | Status: DC | PRN
  Administered 2022-01-10: 500 mL via OPHTHALMIC

## 2022-01-10 MED ORDER — SODIUM HYALURONATE 10 MG/ML IO SOLUTION
PREFILLED_SYRINGE | INTRAOCULAR | Status: DC | PRN
  Administered 2022-01-10: 0.85 mL via INTRAOCULAR

## 2022-01-10 MED ORDER — BSS IO SOLN
INTRAOCULAR | Status: DC | PRN
  Administered 2022-01-10: 15 mL via INTRAOCULAR

## 2022-01-10 MED ORDER — PHENYLEPHRINE-KETOROLAC 1-0.3 % IO SOLN
INTRAOCULAR | Status: AC
  Filled 2022-01-10: qty 4

## 2022-01-10 SURGICAL SUPPLY — 21 items
CATARACT SUITE SIGHTPATH (MISCELLANEOUS) ×2 IMPLANT
CLOTH BEACON ORANGE TIMEOUT ST (SAFETY) ×2 IMPLANT
EYE SHIELD UNIVERSAL CLEAR (GAUZE/BANDAGES/DRESSINGS) ×1 IMPLANT
FEE CATARACT SUITE SIGHTPATH (MISCELLANEOUS) ×1 IMPLANT
GLOVE BIOGEL PI IND STRL 7.0 (GLOVE) ×2 IMPLANT
GLOVE BIOGEL PI IND STRL 8 (GLOVE) IMPLANT
GLOVE BIOGEL PI INDICATOR 7.0 (GLOVE) ×2
GLOVE BIOGEL PI INDICATOR 8 (GLOVE) ×1
GOWN STRL REUS W/TWL XL LVL3 (GOWN DISPOSABLE) ×1 IMPLANT
LENS IOL RAYNER 29.0 ×2 IMPLANT
LENS IOL RAYONE EMV 29.0 IMPLANT
LOOP SUT CHROMIC 2-0 SGL1 (SUTURE) ×1 IMPLANT
NDL HYPO 18GX1.5 BLUNT FILL (NEEDLE) ×1 IMPLANT
NEEDLE HYPO 18GX1.5 BLUNT FILL (NEEDLE) ×2 IMPLANT
PAD ARMBOARD 7.5X6 YLW CONV (MISCELLANEOUS) ×2 IMPLANT
RING MALYGIN 7.0 (MISCELLANEOUS) IMPLANT
SYR TB 1ML LL NO SAFETY (SYRINGE) ×2 IMPLANT
TAPE SURG TRANSPORE 1 IN (GAUZE/BANDAGES/DRESSINGS) IMPLANT
TAPE SURGICAL TRANSPORE 1 IN (GAUZE/BANDAGES/DRESSINGS) ×1
TREPHINE GLAUKO IACCESS TRBCLR (OPHTHALMIC) ×1 IMPLANT
WATER STERILE IRR 250ML POUR (IV SOLUTION) ×2 IMPLANT

## 2022-01-10 NOTE — Discharge Instructions (Signed)
Please discharge patient when stable, will follow up today with Dr. Zimere Dunlevy at the [facility] office immediately following discharge.  Leave shield in place until visit.  All paperwork with discharge instructions will be given at the office.  [facility] [place] Address:  [address]  [place], [place] [address]  

## 2022-01-10 NOTE — Interval H&P Note (Signed)
History and Physical Interval Note: ? ?01/10/2022 ?1:39 PM ? ?Kristen Mccoy  has presented today for surgery, with the diagnosis of combined forms age related cataract; left eye ?glaucoma; left eye.  The various methods of treatment have been discussed with the patient and family. After consideration of risks, benefits and other options for treatment, the patient has consented to  Procedure(s) with comments: ?CATARACT EXTRACTION PHACO AND INTRAOCULAR LENS PLACEMENT (IOC) (Left) - end of day ?Goniotomy (Left) as a surgical intervention.  The patient's history has been reviewed, patient examined, no change in status, stable for surgery.  I have reviewed the patient's chart and labs.  Questions were answered to the patient's satisfaction.   ? ? ?Baruch Goldmann ? ? ?

## 2022-01-10 NOTE — Anesthesia Postprocedure Evaluation (Signed)
Anesthesia Post Note ? ?Patient: Kristen Mccoy ? ?Procedure(s) Performed: CATARACT EXTRACTION PHACO AND INTRAOCULAR LENS PLACEMENT (IOC) (Left: Eye) ?Goniotomy (Left: Eye) ? ?Patient location during evaluation: Short Stay ?Anesthesia Type: MAC ?Level of consciousness: awake and alert ?Pain management: pain level controlled ?Vital Signs Assessment: post-procedure vital signs reviewed and stable ?Respiratory status: spontaneous breathing ?Cardiovascular status: blood pressure returned to baseline ?Postop Assessment: no apparent nausea or vomiting ?Anesthetic complications: no ? ? ?No notable events documented. ? ? ?Last Vitals:  ?Vitals:  ? 01/10/22 1257 01/10/22 1412  ?BP: (!) 167/65 (!) 166/74  ?Pulse: 85   ?Resp: 18 18  ?Temp: 36.8 ?C 36.8 ?C  ?SpO2: 98% 96%  ?  ?Last Pain:  ?Vitals:  ? 01/10/22 1412  ?TempSrc: Oral  ?PainSc: 0-No pain  ? ? ?  ?  ?  ?  ?  ?  ? ?Kaliel Bolds ? ? ? ? ?

## 2022-01-10 NOTE — Op Note (Signed)
Date of procedure: 01/10/22 ? ?Pre-operative diagnosis:  ?1) Visually significant age-related combined-form cataract, Left Eye (H25.812) ?2) Primary Open Angle Glaucoma, mild Stage, Left Eye ? ?Post-operative diagnosis: ?1) Visually significant age-related cataract, Left Eye ?2) Primary Open Angle Glaucoma, mild Stage, Left Eye ? ?Procedure:  ?1) Removal of cataract via phacoemulsification and insertion of intra-ocular lens Rayner [id number] +29.0D into the capsular bag of the Left Eye ?2) Goniotomy treatment of nasal trabecular meshwork with iAccess, Left Eye ? ?Attending surgeon: Gerda Diss. Marisa Hua, MD, MA ? ?Anesthesia: MAC, Topical Ak[place] ? ?Complications: None ? ?Estimated Blood Loss: <52m (minimal) ? ?Specimens: None ? ?Implants: As above ? ?Indications:  Visually significant age-related cataract, Left Eye; Glaucoma, Left Eye ? ?Procedure:  ?The patient was seen and identified in the pre-operative area. The operative eye was identified and dilated.  The operative eye was marked.  Topical anesthesia was administered to the operative eye.    ? ?The patient was then to the operative suite and placed in the supine position.  A timeout was performed confirming the patient, procedure to be performed, and all other relevant information.   The patient's face was prepped and draped in the usual fashion for intra-ocular surgery.  A lid speculum was placed into the operative eye and the surgical microscope moved into place and focused. An inferotemporal paracentesis was created using a 20 gauge paracentesis blade.  Shugarcaine was injected into the anterior chamber.  Viscoelastic was injected into the anterior chamber.  A temporal clear-corneal main wound incision was created using a 2.492mmicrokeratome.  A continuous curvilinear capsulorrhexis was initiated using an irrigating cystitome and completed using capsulorrhexis forceps.  Hydrodissection and hydrodeliniation were performed.  Viscoelastic was injected into the  anterior chamber.  A phacoemulsification handpiece and a chopper as a second instrument were used to remove the nucleus and epinucleus. The irrigation/aspiration handpiece was used to remove any remaining cortical material.  ? ?The capsular bag was reinflated with viscoelastic, checked, and found to be intact.  The intraocular lens was inserted into the capsular bag and dialed into place using a Kuglen hook.   ? ?The patient's head was repositioned.  The iAccess was used to extensively treat the nasal trabecular meshwork for 90 degrees for a total of 5 goniotomies under gonioscopy. ? ?The patient's head was returned to neutral.  The irrigation/aspiration handpiece was used to remove any remaining viscoelastic.  The clear corneal wound and paracentesis wounds were then hydrated and checked with Weck-Cels to be watertight.  The lid-speculum was removed.  The drape was removed.  The patient's face was cleaned with a wet and dry 4x4.  Maxitrol was instilled in the eye before a clear shield was taped over the eye. The patient was taken to the post-operative care unit in good condition, having tolerated the procedure well. ? ?Post-Op Instructions: [facility] and will receive all other orders and instructions. ? ?

## 2022-01-10 NOTE — Anesthesia Preprocedure Evaluation (Signed)
Anesthesia Evaluation  ?Patient identified by MRN, date of birth, ID band ?Patient awake ? ? ? ?Reviewed: ?Allergy & Precautions, H&P , NPO status , Patient's Chart, lab work & pertinent test results, reviewed documented beta blocker date and time  ? ?Airway ?Mallampati: II ? ?TM Distance: >3 FB ?Neck ROM: full ? ? ? Dental ?no notable dental hx. ? ?  ?Pulmonary ?neg pulmonary ROS,  ?  ?Pulmonary exam normal ?breath sounds clear to auscultation ? ? ? ? ? ? Cardiovascular ?Exercise Tolerance: Good ?hypertension, negative cardio ROS ? ? ?Rhythm:regular Rate:Normal ? ? ?  ?Neuro/Psych ?negative neurological ROS ? negative psych ROS  ? GI/Hepatic ?negative GI ROS, Neg liver ROS,   ?Endo/Other  ?negative endocrine ROSdiabetes, Type 2 ? Renal/GU ?negative Renal ROS  ?negative genitourinary ?  ?Musculoskeletal ? ? Abdominal ?  ?Peds ? Hematology ?negative hematology ROS ?(+)   ?Anesthesia Other Findings ? ? Reproductive/Obstetrics ?negative OB ROS ? ?  ? ? ? ? ? ? ? ? ? ? ? ? ? ?  ?  ? ? ? ? ? ? ? ? ?Anesthesia Physical ?Anesthesia Plan ? ?ASA: 2 ? ?Anesthesia Plan: MAC  ? ?Post-op Pain Management:   ? ?Induction:  ? ?PONV Risk Score and Plan:  ? ?Airway Management Planned:  ? ?Additional Equipment:  ? ?Intra-op Plan:  ? ?Post-operative Plan:  ? ?Informed Consent: I have reviewed the patients History and Physical, chart, labs and discussed the procedure including the risks, benefits and alternatives for the proposed anesthesia with the patient or authorized representative who has indicated his/her understanding and acceptance.  ? ? ? ?Dental Advisory Given ? ?Plan Discussed with: CRNA ? ?Anesthesia Plan Comments:   ? ? ? ? ? ? ?Anesthesia Quick Evaluation ? ?

## 2022-01-10 NOTE — Transfer of Care (Signed)
Immediate Anesthesia Transfer of Care Note ? ?Patient: Kristen Mccoy ? ?Procedure(s) Performed: CATARACT EXTRACTION PHACO AND INTRAOCULAR LENS PLACEMENT (IOC) (Left: Eye) ?Goniotomy (Left: Eye) ? ?Patient Location: Short Stay ? ?Anesthesia Type:MAC ? ?Level of Consciousness: awake ? ?Airway & Oxygen Therapy: Patient Spontanous Breathing ? ?Post-op Assessment: Report given to RN ? ?Post vital signs: Reviewed ? ?Last Vitals:  ?Vitals Value Taken Time  ?BP 166/74 01/10/22 1412  ?Temp 36.8 ?C 01/10/22 1412  ?Pulse    ?Resp 18 01/10/22 1412  ?SpO2 96 % 01/10/22 1412  ? ? ?Last Pain:  ?Vitals:  ? 01/10/22 1412  ?TempSrc: Oral  ?PainSc: 0-No pain  ?   ? ?  ? ?Complications: No notable events documented. ?

## 2022-01-14 ENCOUNTER — Encounter (HOSPITAL_COMMUNITY): Payer: Self-pay | Admitting: Ophthalmology

## 2022-01-16 ENCOUNTER — Ambulatory Visit (HOSPITAL_COMMUNITY)
Admission: RE | Admit: 2022-01-16 | Discharge: 2022-01-16 | Disposition: A | Discharge status: Home / Self Care | Payer: Medicare Other | Source: Ambulatory Visit | Attending: Radiation Oncology | Admitting: Radiation Oncology

## 2022-01-16 DIAGNOSIS — C541 Malignant neoplasm of endometrium: Secondary | ICD-10-CM | POA: Insufficient documentation

## 2022-01-16 DIAGNOSIS — M47816 Spondylosis without myelopathy or radiculopathy, lumbar region: Secondary | ICD-10-CM | POA: Diagnosis not present

## 2022-01-16 DIAGNOSIS — N281 Cyst of kidney, acquired: Secondary | ICD-10-CM | POA: Diagnosis not present

## 2022-01-16 DIAGNOSIS — M79604 Pain in right leg: Secondary | ICD-10-CM | POA: Diagnosis not present

## 2022-01-16 MED ORDER — IOHEXOL 300 MG/ML  SOLN
100.0000 mL | Freq: Once | INTRAMUSCULAR | Status: AC | PRN
  Administered 2022-01-16: 80 mL via INTRAVENOUS

## 2022-01-17 NOTE — H&P (Signed)
Surgical History & Physical ? ?Patient Name: Kristen Mccoy DOB: [birth year] ? ?Surgery: Cataract extraction with intraocular lens implant phacoemulsification & Goniotomy Treatment (iAccess); Right Eye ? ?Surgeon: Baruch Goldmann MD ?Surgery Date:  01-24-22 ?Pre-Op Date:  01-16-22 ? ?HPI: ?A 49 Yr. old female patient 1. The patient is returning after cataract post-op. The left eye is affected. Status post cataract post-op phaco c IOL OS-Glaucoma, which began 6 days ago: Since the last visit, the affected area is doing well. The patient's vision is improved. Patient is following medication instructions, patient is using Simbrinza BID OU, Travatan QHS OU, and the combination post op drop TID OS. The patient experiences no eye pain and no flashes, floater, shadow, curtain or veil. HPI was performed by Baruch Goldmann . ? 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 ?Review of Systems ?Negative Allergic/Immunologic ?Negative Cardiovascular ?Negative Constitutional ?Negative Ear, Nose, Mouth & Throat ?Negative Endocrine ?Negative Eyes ?Negative Gastrointestinal ?Negative Genitourinary ?Negative Hemotologic/Lymphatic ?Negative Integumentary ?Negative Musculoskeletal ?Negative Neurological ?Negative Psychiatry ?Negative Respiratory ? ?Social ?  Never smoked  ? ?Medication ?Travatan Z, Simbrinza, Prednisolone-Moxifloxacin-Bromfenac,  ?Januvia, Metformin, Vitamin D3,  ? ?Sx/Procedures ?Phaco c IOL OS-Glaucoma,  ?Stomach cancer removed,  ? ?Drug Allergies  ?Peniciilin, Aspirin, Lisinopril,  ? ?History & Physical: ?Heent: Cataract & Glaucoma; Right Eye ?NECK: supple without bruits ?LUNGS: lungs clear to auscultation ?CV: regular rate and rhythm ?Abdomen: soft and non-tender ? ?Impression & Plan: ?Assessment: ?1.  CATARACT EXTRACTION STATUS; Left Eye [phone]) ?2.  COMBINED FORMS AGE RELATED CATARACT; Both Eyes  (H25.813) ?3.  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 ?Plan: 1.  1 week after cataract surgery. Doing well with improved vision and normal eye pressure. Call with any problems or concerns. ?Continue Pred-Moxi-Brom 2x/day for 2 more weeks. ?Stop Travatan left eye. ? ?2.  Cataract accounts for the patient's decreased vision. This visual impairment is not correctable with a tolerable change in glasses or contact lenses. Cataract surgery with an implantation of a new lens should significantly improve the visual and functional status of the patient. Discussed all risks, benefits, alternatives, and potential complications. Discussed the procedures and recovery. Patient desires to have surgery. A-scan ordered and performed today for intra-ocular lens calculations. The surgery will be performed in order to improve vision for driving, reading, and for eye examinations. Recommend phacoemulsification with intra-ocular lens. Recommend Dextenza for post-operative pain and inflammation. ?Left Eye worse - first. ?Dilates poorly - shugarcaine by protocol. ?Malyugin Ring. ?Omidira. ?Recommend iAccess to control pressure in setting of glaucoma. ?Consider Toric IOL ? ?3.  Currently on multiple medications. ?S/P iAccess OS. ?Causing keratitis. ?IOLs at goal today. ?Continue Travatan Z OD only (stop OS) and Simbrinza OU. ?Recommend iAccess goniotomy right eye at the time of cataract surgery to improve compliance and help lower eye pressure as well as to improve the ocular surface. ?

## 2022-01-20 ENCOUNTER — Other Ambulatory Visit: Payer: Self-pay

## 2022-01-20 ENCOUNTER — Encounter (HOSPITAL_COMMUNITY)
Admission: RE | Admit: 2022-01-20 | Discharge: 2022-01-20 | Disposition: A | Discharge status: Home / Self Care | Payer: Medicare Other | Source: Ambulatory Visit | Attending: Ophthalmology | Admitting: Ophthalmology

## 2022-01-20 ENCOUNTER — Encounter (HOSPITAL_COMMUNITY): Payer: Self-pay

## 2022-01-20 DIAGNOSIS — H2512 Age-related nuclear cataract, left eye: Secondary | ICD-10-CM | POA: Diagnosis not present

## 2022-01-21 ENCOUNTER — Other Ambulatory Visit: Payer: Self-pay | Admitting: Radiation Oncology

## 2022-01-21 DIAGNOSIS — C541 Malignant neoplasm of endometrium: Secondary | ICD-10-CM

## 2022-01-22 NOTE — Progress Notes (Signed)
Patient called.  Patient aware.  

## 2022-01-23 ENCOUNTER — Encounter (HOSPITAL_COMMUNITY): Payer: Self-pay | Admitting: Anesthesiology

## 2022-01-24 MED ORDER — EPINEPHRINE PF 1 MG/ML IJ SOLN
INTRAMUSCULAR | Status: AC
  Filled 2022-01-24: qty 1

## 2022-01-24 MED ORDER — PHENYLEPHRINE-KETOROLAC 1-0.3 % IO SOLN
INTRAOCULAR | Status: AC
  Filled 2022-01-24: qty 4

## 2022-01-24 MED ORDER — FENTANYL CITRATE (PF) 100 MCG/2ML IJ SOLN
INTRAMUSCULAR | Status: AC
Start: 2022-01-24 — End: ?
  Filled 2022-01-24: qty 2

## 2022-01-24 MED ORDER — MIDAZOLAM HCL 2 MG/2ML IJ SOLN
INTRAMUSCULAR | Status: AC
  Filled 2022-01-24: qty 2

## 2022-01-24 NOTE — Progress Notes (Signed)
PT called at this time, states she thought she was supposed to be here at 2pm. Pt states she has to get her ride and can be here in 41mn. Dr. WLeamon Arntaware and would like her to reschedule. This RN gave pt Carolyn's number to reschedule, pt in agreement.  ?

## 2022-01-28 DIAGNOSIS — E1165 Type 2 diabetes mellitus with hyperglycemia: Secondary | ICD-10-CM | POA: Diagnosis not present

## 2022-01-28 DIAGNOSIS — I10 Essential (primary) hypertension: Secondary | ICD-10-CM | POA: Diagnosis not present

## 2022-01-28 DIAGNOSIS — E11319 Type 2 diabetes mellitus with unspecified diabetic retinopathy without macular edema: Secondary | ICD-10-CM | POA: Diagnosis not present

## 2022-01-28 DIAGNOSIS — E1142 Type 2 diabetes mellitus with diabetic polyneuropathy: Secondary | ICD-10-CM | POA: Diagnosis not present

## 2022-01-28 DIAGNOSIS — Z299 Encounter for prophylactic measures, unspecified: Secondary | ICD-10-CM | POA: Diagnosis not present

## 2022-01-30 ENCOUNTER — Ambulatory Visit (HOSPITAL_COMMUNITY)
Admission: RE | Admit: 2022-01-30 | Discharge: 2022-01-30 | Disposition: A | Discharge status: Home / Self Care | Payer: Medicare Other | Source: Ambulatory Visit | Attending: Radiation Oncology | Admitting: Radiation Oncology

## 2022-01-30 DIAGNOSIS — C541 Malignant neoplasm of endometrium: Secondary | ICD-10-CM | POA: Diagnosis present

## 2022-01-30 DIAGNOSIS — S32000A Wedge compression fracture of unspecified lumbar vertebra, initial encounter for closed fracture: Secondary | ICD-10-CM | POA: Diagnosis not present

## 2022-01-30 DIAGNOSIS — M4699 Unspecified inflammatory spondylopathy, multiple sites in spine: Secondary | ICD-10-CM | POA: Diagnosis not present

## 2022-01-30 DIAGNOSIS — M48061 Spinal stenosis, lumbar region without neurogenic claudication: Secondary | ICD-10-CM | POA: Diagnosis not present

## 2022-01-30 MED ORDER — GADOBUTROL 1 MMOL/ML IV SOLN
5.0000 mL | Freq: Once | INTRAVENOUS | Status: AC | PRN
  Administered 2022-01-30: 5 mL via INTRAVENOUS

## 2022-02-04 ENCOUNTER — Telehealth: Payer: Self-pay

## 2022-02-04 NOTE — Telephone Encounter (Signed)
Called to make patient aware of  recent MRI results per Dr. Sondra Come. Per Dr. Sondra Come patient to follow up with PCP or orthopedist due to disc issues. Patient voiced understanding.  ?

## 2022-02-19 DIAGNOSIS — E7800 Pure hypercholesterolemia, unspecified: Secondary | ICD-10-CM | POA: Diagnosis not present

## 2022-02-19 DIAGNOSIS — I10 Essential (primary) hypertension: Secondary | ICD-10-CM | POA: Diagnosis not present

## 2022-03-05 ENCOUNTER — Ambulatory Visit
Admission: RE | Admit: 2022-03-05 | Discharge: 2022-03-05 | Disposition: A | Discharge status: Home / Self Care | Payer: Medicare Other | Source: Ambulatory Visit | Attending: Internal Medicine | Admitting: Internal Medicine

## 2022-03-05 DIAGNOSIS — Z1231 Encounter for screening mammogram for malignant neoplasm of breast: Secondary | ICD-10-CM | POA: Diagnosis not present

## 2022-03-06 NOTE — H&P (Signed)
Surgical History & Physical  Patient Name: Kristen Mccoy DOB: [birth year]  Surgery: Cataract extraction with intraocular lens implant phacoemulsification & Goniotomy Treatment (iAccess); Right Eye  Surgeon: Baruch Goldmann MD Surgery Date:  03-17-22 Pre-Op Date:  03-06-22  HPI: A 44 Yr. old female patient 1. The patient is returning after cataract post-op. The left eye is affected. Status post cataract post-op phaco c IOL OS-Glaucoma, which began 6 days ago: Since the last visit, the affected area is doing well. The patient's vision is improved. Patient is following medication instructions, patient is using Simbrinza BID OU, Travatan QHS OU, and the combination post op drop TID OS. The patient experiences no eye pain and no flashes, floater, shadow, curtain or veil. Patient also presents for persistent blurred vision in the right eye. She has trouble seeing things in low light. Reading is particularly difficult. There is also a very large different between the left and right eyes, which is bothersome. This is negatively affecting the patient's quality of life and the patient is unable to function adequately in life with the current level of vision. HPI was performed by Baruch Goldmann .  Medical History: Dry Eyes Glaucoma Cataracts Amblyopia OS, White without pressure, Retinal drusen, Dermatochalasis Cancer Diabetes High Blood Pressure Reflux  Review of Systems Negative Allergic/Immunologic Negative Cardiovascular Negative Constitutional Negative Ear, Nose, Mouth & Throat Negative Endocrine Negative Eyes Negative Gastrointestinal Negative Genitourinary Negative Hemotologic/Lymphatic Negative Integumentary Negative Musculoskeletal Negative Neurological Negative Psychiatry Negative Respiratory  Social   Never smoked   Medication Travatan Z, Simbrinza, Prednisolone-Moxifloxacin-Bromfenac,  Januvia, Metformin, Vitamin D3,   Sx/Procedures Phaco c IOL OS-Glaucoma,  Stomach  cancer removed,   Drug Allergies  Peniciilin, Aspirin, Lisinopril,   History & Physical: Heent: Cataract & Glaucoma; Right Eye NECK: supple without bruits LUNGS: lungs clear to auscultation CV: regular rate and rhythm Abdomen: soft and non-tender Impression & Plan: Assessment: 1.  CATARACT EXTRACTION STATUS; Left Eye (Z98.42) 2.  COMBINED FORMS AGE RELATED CATARACT; Both Eyes (H25.813) 3.  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  1 week after cataract surgery. Doing well with improved vision and normal eye pressure. Call with any problems or concerns. Continue Pred-Moxi-Brom 2x/day for 2 more weeks. Stop Travatan left eye.  2.  Cataract accounts for the patient's decreased vision. This visual impairment is not correctable with a tolerable change in glasses or contact lenses. Cataract surgery with an implantation of a new lens should significantly improve the visual and functional status of the patient. Discussed all risks, benefits, alternatives, and potential complications. Discussed the procedures and recovery. Patient desires to have surgery. A-scan ordered and performed today for intra-ocular lens calculations. The surgery will be performed in order to improve vision for driving, reading, and for eye examinations. Recommend phacoemulsification with intra-ocular lens. Recommend Dextenza for post-operative pain and inflammation. Left Eye worse - first. Dilates poorly - shugarcaine by protocol. Malyugin Ring. Omidira. Recommend iAccess to control pressure in setting of glaucoma. Consider Toric IOL  3.  Currently on multiple medications. S/P iAccess OS. Causing keratitis. IOLs at goal today. Continue Travatan Z OD only (stop OS) and Simbrinza OU. Recommend iAccess goniotomy right eye at the time of cataract surgery to improve compliance and help lower eye pressure as well as to improve the ocular surface.

## 2022-03-10 DIAGNOSIS — H25811 Combined forms of age-related cataract, right eye: Secondary | ICD-10-CM | POA: Diagnosis not present

## 2022-03-11 DIAGNOSIS — E1142 Type 2 diabetes mellitus with diabetic polyneuropathy: Secondary | ICD-10-CM | POA: Diagnosis not present

## 2022-03-11 DIAGNOSIS — B351 Tinea unguium: Secondary | ICD-10-CM | POA: Diagnosis not present

## 2022-03-11 DIAGNOSIS — M79676 Pain in unspecified toe(s): Secondary | ICD-10-CM | POA: Diagnosis not present

## 2022-03-11 DIAGNOSIS — L84 Corns and callosities: Secondary | ICD-10-CM | POA: Diagnosis not present

## 2022-03-14 ENCOUNTER — Encounter (HOSPITAL_COMMUNITY)
Admission: RE | Admit: 2022-03-14 | Discharge: 2022-03-14 | Disposition: A | Discharge status: Home / Self Care | Payer: Medicare Other | Source: Ambulatory Visit | Attending: Ophthalmology | Admitting: Ophthalmology

## 2022-03-17 ENCOUNTER — Encounter (HOSPITAL_COMMUNITY)
Admission: RE | Disposition: A | Discharge status: Home / Self Care | Payer: Self-pay | Source: Home / Self Care | Attending: Ophthalmology

## 2022-03-17 ENCOUNTER — Other Ambulatory Visit: Payer: Self-pay

## 2022-03-17 ENCOUNTER — Ambulatory Visit (HOSPITAL_COMMUNITY): Payer: Medicare Other | Admitting: Anesthesiology

## 2022-03-17 ENCOUNTER — Ambulatory Visit (HOSPITAL_COMMUNITY)
Admission: RE | Admit: 2022-03-17 | Discharge: 2022-03-17 | Disposition: A | Discharge status: Home / Self Care | Payer: Medicare Other | Attending: Ophthalmology | Admitting: Ophthalmology

## 2022-03-17 ENCOUNTER — Encounter (HOSPITAL_COMMUNITY): Payer: Self-pay | Admitting: Ophthalmology

## 2022-03-17 ENCOUNTER — Ambulatory Visit (HOSPITAL_BASED_OUTPATIENT_CLINIC_OR_DEPARTMENT_OTHER): Payer: Medicare Other | Admitting: Anesthesiology

## 2022-03-17 DIAGNOSIS — H25811 Combined forms of age-related cataract, right eye: Secondary | ICD-10-CM

## 2022-03-17 DIAGNOSIS — Z79899 Other long term (current) drug therapy: Secondary | ICD-10-CM | POA: Diagnosis not present

## 2022-03-17 DIAGNOSIS — Z794 Long term (current) use of insulin: Secondary | ICD-10-CM | POA: Diagnosis not present

## 2022-03-17 DIAGNOSIS — H401111 Primary open-angle glaucoma, right eye, mild stage: Secondary | ICD-10-CM | POA: Insufficient documentation

## 2022-03-17 DIAGNOSIS — H409 Unspecified glaucoma: Secondary | ICD-10-CM

## 2022-03-17 DIAGNOSIS — Z7984 Long term (current) use of oral hypoglycemic drugs: Secondary | ICD-10-CM

## 2022-03-17 DIAGNOSIS — E1139 Type 2 diabetes mellitus with other diabetic ophthalmic complication: Secondary | ICD-10-CM | POA: Diagnosis not present

## 2022-03-17 DIAGNOSIS — H401110 Primary open-angle glaucoma, right eye, stage unspecified: Secondary | ICD-10-CM | POA: Diagnosis not present

## 2022-03-17 DIAGNOSIS — Z9842 Cataract extraction status, left eye: Secondary | ICD-10-CM | POA: Insufficient documentation

## 2022-03-17 DIAGNOSIS — E119 Type 2 diabetes mellitus without complications: Secondary | ICD-10-CM | POA: Diagnosis not present

## 2022-03-17 DIAGNOSIS — I10 Essential (primary) hypertension: Secondary | ICD-10-CM | POA: Diagnosis not present

## 2022-03-17 DIAGNOSIS — E1136 Type 2 diabetes mellitus with diabetic cataract: Secondary | ICD-10-CM | POA: Insufficient documentation

## 2022-03-17 HISTORY — PX: CATARACT EXTRACTION W/PHACO: SHX586

## 2022-03-17 LAB — GLUCOSE, CAPILLARY: Glucose-Capillary: 89 mg/dL (ref 70–99)

## 2022-03-17 SURGERY — PHACOEMULSIFICATION, CATARACT, WITH IOL INSERTION
Anesthesia: Monitor Anesthesia Care | Site: Eye | Laterality: Right

## 2022-03-17 MED ORDER — SODIUM HYALURONATE 23MG/ML IO SOSY
PREFILLED_SYRINGE | INTRAOCULAR | Status: DC | PRN
  Administered 2022-03-17: 0.6 mL via INTRAOCULAR

## 2022-03-17 MED ORDER — PHENYLEPHRINE-KETOROLAC 1-0.3 % IO SOLN
INTRAOCULAR | Status: AC
  Filled 2022-03-17: qty 4

## 2022-03-17 MED ORDER — TROPICAMIDE 1 % OP SOLN
1.0000 [drp] | OPHTHALMIC | Status: AC | PRN
  Administered 2022-03-17 (×3): 1 [drp] via OPHTHALMIC

## 2022-03-17 MED ORDER — FENTANYL CITRATE PF 50 MCG/ML IJ SOSY
PREFILLED_SYRINGE | INTRAMUSCULAR | Status: AC
  Filled 2022-03-17: qty 1

## 2022-03-17 MED ORDER — FENTANYL CITRATE (PF) 100 MCG/2ML IJ SOLN
INTRAMUSCULAR | Status: AC
  Filled 2022-03-17: qty 2

## 2022-03-17 MED ORDER — FENTANYL CITRATE (PF) 100 MCG/2ML IJ SOLN
INTRAMUSCULAR | Status: DC | PRN
Start: 2022-03-17 — End: 2022-03-17
  Administered 2022-03-17: 100 ug via INTRAVENOUS

## 2022-03-17 MED ORDER — TETRACAINE HCL 0.5 % OP SOLN
1.0000 [drp] | OPHTHALMIC | Status: AC | PRN
  Administered 2022-03-17 (×3): 1 [drp] via OPHTHALMIC

## 2022-03-17 MED ORDER — PHENYLEPHRINE-KETOROLAC 1-0.3 % IO SOLN
INTRAOCULAR | Status: DC | PRN
  Administered 2022-03-17: 500 mL via OPHTHALMIC

## 2022-03-17 MED ORDER — SODIUM HYALURONATE 10 MG/ML IO SOLUTION
PREFILLED_SYRINGE | INTRAOCULAR | Status: DC | PRN
  Administered 2022-03-17: 0.85 mL via INTRAOCULAR

## 2022-03-17 MED ORDER — NEOMYCIN-POLYMYXIN-DEXAMETH 3.5-10000-0.1 OP SUSP
OPHTHALMIC | Status: DC | PRN
  Administered 2022-03-17: 2 [drp] via OPHTHALMIC

## 2022-03-17 MED ORDER — BSS IO SOLN
INTRAOCULAR | Status: DC | PRN
  Administered 2022-03-17: 15 mL via INTRAOCULAR

## 2022-03-17 MED ORDER — SODIUM CHLORIDE 0.9% FLUSH
INTRAVENOUS | Status: DC | PRN
  Administered 2022-03-17: 3 mL via INTRAVENOUS

## 2022-03-17 MED ORDER — LIDOCAINE HCL (PF) 1 % IJ SOLN
INTRAOCULAR | Status: DC | PRN
  Administered 2022-03-17: 1 mL via OPHTHALMIC

## 2022-03-17 MED ORDER — PHENYLEPHRINE HCL 2.5 % OP SOLN
1.0000 [drp] | OPHTHALMIC | Status: AC | PRN
  Administered 2022-03-17 (×3): 1 [drp] via OPHTHALMIC

## 2022-03-17 MED ORDER — POVIDONE-IODINE 5 % OP SOLN
OPHTHALMIC | Status: DC | PRN
  Administered 2022-03-17: 1 via OPHTHALMIC

## 2022-03-17 MED ORDER — MIDAZOLAM HCL 5 MG/5ML IJ SOLN: INTRAMUSCULAR | Status: DC | PRN

## 2022-03-17 MED ORDER — LIDOCAINE HCL 3.5 % OP GEL
1.0000 | Freq: Once | OPHTHALMIC | Status: AC
  Administered 2022-03-17: 1 via OPHTHALMIC

## 2022-03-17 MED ORDER — STERILE WATER FOR IRRIGATION IR SOLN
Status: DC | PRN
  Administered 2022-03-17: 250 mL

## 2022-03-17 SURGICAL SUPPLY — 17 items
CATARACT SUITE SIGHTPATH (MISCELLANEOUS) ×2 IMPLANT
CLIP IPRISM (KITS) ×1 IMPLANT
CLOTH BEACON ORANGE TIMEOUT ST (SAFETY) ×2 IMPLANT
EYE SHIELD UNIVERSAL CLEAR (GAUZE/BANDAGES/DRESSINGS) ×1 IMPLANT
FEE CATARACT SUITE SIGHTPATH (MISCELLANEOUS) ×1 IMPLANT
GLOVE BIOGEL PI IND STRL 7.0 (GLOVE) ×2 IMPLANT
GLOVE BIOGEL PI INDICATOR 7.0 (GLOVE) ×2
LENS IOL RAYNER 22.0 (Intraocular Lens) ×2 IMPLANT
LENS IOL RAYONE EMV 22.0 (Intraocular Lens) IMPLANT
NDL HYPO 18GX1.5 BLUNT FILL (NEEDLE) ×1 IMPLANT
NEEDLE HYPO 18GX1.5 BLUNT FILL (NEEDLE) ×4 IMPLANT
PAD ARMBOARD 7.5X6 YLW CONV (MISCELLANEOUS) ×2 IMPLANT
SYR TB 1ML LL NO SAFETY (SYRINGE) ×2 IMPLANT
TAPE SURG TRANSPORE 1 IN (GAUZE/BANDAGES/DRESSINGS) IMPLANT
TAPE SURGICAL TRANSPORE 1 IN (GAUZE/BANDAGES/DRESSINGS) ×1
TREPHINE GLAUKO IACCESS TRBCLR (OPHTHALMIC) ×1 IMPLANT
WATER STERILE IRR 250ML POUR (IV SOLUTION) ×2 IMPLANT

## 2022-03-17 NOTE — Op Note (Signed)
Date of procedure: 03/17/22  Pre-operative diagnosis:  1) Visually significant age-related combined-form cataract, Right Eye (H25.?1) 2) Primary Open Angle Glaucoma, ??? Stage, Right Eye  Post-operative diagnosis: 1) Visually significant age-related cataract, Right Eye 2) Primary Open Angle Glaucoma, ??? Stage, Right Eye  Procedure:  1) Removal of cataract via phacoemulsification and insertion of intra-ocular lens Rayner [id number] +22.0D into the capsular bag of the Right Eye 2) Goniotomy treatment of nasal trabecular meshwork with iAccess, Right Eye  Attending surgeon: Rudy Jew. Zamora Colton, MD, MA  Anesthesia: MAC, Topical Akten  Complications: None  Estimated Blood Loss: <31mL (minimal)  Specimens: None  Implants: As above  Indications:  Visually significant age-related cataract, Right Eye; Glaucoma, Right Eye  Procedure:  The patient was seen and identified in the pre-operative area. The operative eye was identified and dilated.  The operative eye was marked.  Topical anesthesia was administered to the operative eye.     The patient was then to the operative suite and placed in the supine position.  A timeout was performed confirming the patient, procedure to be performed, and all other relevant information.   The patient's face was prepped and draped in the usual fashion for intra-ocular surgery.  A lid speculum was placed into the operative eye and the surgical microscope moved into place and focused.  A superotemporal paracentesis was created using a 20 gauge paracentesis blade.  Shugarcaine was injected into the anterior chamber.  Viscoelastic was injected into the anterior chamber.  A temporal clear-corneal main wound incision was created using a 2.32mm microkeratome.  A continuous curvilinear capsulorrhexis was initiated using an irrigating cystitome and completed using capsulorrhexis forceps.  Hydrodissection and hydrodeliniation were performed.  Viscoelastic was injected into the  anterior chamber.  A phacoemulsification handpiece and a chopper as a second instrument were used to remove the nucleus and epinucleus. The irrigation/aspiration handpiece was used to remove any remaining cortical material.   The capsular bag was reinflated with viscoelastic, checked, and found to be intact.  The intraocular lens was inserted into the capsular bag and dialed into place using a Kuglen hook.    The patient's head was repositioned.  The iAccess was used to extensively treat the nasal trabecular meshwork for 90 degrees for a total of 5 goniotomies under gonioscopy.  The patient's head was returned to neutral.  The irrigation/aspiration handpiece was used to remove any remaining viscoelastic.  The clear corneal wound and paracentesis wounds were then hydrated and checked with Weck-Cels to be watertight.  The lid-speculum was removed.  The drape was removed.  The patient's face was cleaned with a wet and dry 4x4.  Maxitrol was instilled in the eye before a clear shield was taped over the eye. The patient was taken to the post-operative care unit in good condition, having tolerated the procedure well.  Post-Op Instructions: The patient will follow up at [facility] for a same day post-operative evaluation and will receive all other orders and instructions.

## 2022-03-17 NOTE — Transfer of Care (Signed)
Immediate Anesthesia Transfer of Care Note  Patient: Kristen Mccoy  Procedure(s) Performed: CATARACT EXTRACTION PHACO AND INTRAOCULAR LENS PLACEMENT (IOC) (Right: Eye) Goniotomy (Right: Eye)  Patient Location: Short Stay  Anesthesia Type:MAC  Level of Consciousness: awake, alert  and oriented  Airway & Oxygen Therapy: Patient Spontanous Breathing  Post-op Assessment: Report given to RN and Post -op Vital signs reviewed and stable  Post vital signs: Reviewed and stable  Last Vitals:  Vitals Value Taken Time  BP    Temp    Pulse    Resp    SpO2      Last Pain:  Vitals:   03/17/22 1232  TempSrc: Oral  PainSc: 0-No pain      Patients Stated Pain Goal: 4 (03/17/22 1232)  Complications: No notable events documented.

## 2022-03-19 NOTE — Anesthesia Postprocedure Evaluation (Signed)
Anesthesia Post Note  Patient: MADDALYN LUTZE  Procedure(s) Performed: CATARACT EXTRACTION PHACO AND INTRAOCULAR LENS PLACEMENT (IOC) (Right: Eye) Goniotomy (Right: Eye)  Patient location during evaluation: Phase II Anesthesia Type: MAC Level of consciousness: awake Pain management: pain level controlled Vital Signs Assessment: post-procedure vital signs reviewed and stable Respiratory status: spontaneous breathing and respiratory function stable Cardiovascular status: blood pressure returned to baseline and stable Postop Assessment: no headache and no apparent nausea or vomiting Anesthetic complications: no Comments: Late entry   No notable events documented.   Last Vitals:  Vitals:   03/17/22 1232 03/17/22 1320  BP: (!) 162/68 (!) 173/71  Pulse: 91 98  Resp: 18 16  Temp: 36.9 C 36.8 C  SpO2: 97% 98%    Last Pain:  Vitals:   03/17/22 1320  TempSrc: Oral  PainSc: 0-No pain                 Louann Sjogren

## 2022-03-26 ENCOUNTER — Encounter (HOSPITAL_COMMUNITY): Payer: Self-pay | Admitting: Ophthalmology

## 2022-03-27 DIAGNOSIS — H2511 Age-related nuclear cataract, right eye: Secondary | ICD-10-CM | POA: Diagnosis not present

## 2022-04-30 DIAGNOSIS — Z0000 Encounter for general adult medical examination without abnormal findings: Secondary | ICD-10-CM | POA: Diagnosis not present

## 2022-04-30 DIAGNOSIS — Z299 Encounter for prophylactic measures, unspecified: Secondary | ICD-10-CM | POA: Diagnosis not present

## 2022-04-30 DIAGNOSIS — I70211 Atherosclerosis of native arteries of extremities with intermittent claudication, right leg: Secondary | ICD-10-CM | POA: Diagnosis not present

## 2022-04-30 DIAGNOSIS — I739 Peripheral vascular disease, unspecified: Secondary | ICD-10-CM | POA: Diagnosis not present

## 2022-04-30 DIAGNOSIS — E1142 Type 2 diabetes mellitus with diabetic polyneuropathy: Secondary | ICD-10-CM | POA: Diagnosis not present

## 2022-04-30 DIAGNOSIS — E1165 Type 2 diabetes mellitus with hyperglycemia: Secondary | ICD-10-CM | POA: Diagnosis not present

## 2022-04-30 DIAGNOSIS — I10 Essential (primary) hypertension: Secondary | ICD-10-CM | POA: Diagnosis not present

## 2022-04-30 DIAGNOSIS — Z6822 Body mass index (BMI) 22.0-22.9, adult: Secondary | ICD-10-CM | POA: Diagnosis not present

## 2022-05-12 DIAGNOSIS — I70211 Atherosclerosis of native arteries of extremities with intermittent claudication, right leg: Secondary | ICD-10-CM | POA: Diagnosis not present

## 2022-05-21 DIAGNOSIS — Z6822 Body mass index (BMI) 22.0-22.9, adult: Secondary | ICD-10-CM | POA: Diagnosis not present

## 2022-05-21 DIAGNOSIS — I739 Peripheral vascular disease, unspecified: Secondary | ICD-10-CM | POA: Diagnosis not present

## 2022-05-21 DIAGNOSIS — E1142 Type 2 diabetes mellitus with diabetic polyneuropathy: Secondary | ICD-10-CM | POA: Diagnosis not present

## 2022-05-21 DIAGNOSIS — Z299 Encounter for prophylactic measures, unspecified: Secondary | ICD-10-CM | POA: Diagnosis not present

## 2022-05-21 DIAGNOSIS — I10 Essential (primary) hypertension: Secondary | ICD-10-CM | POA: Diagnosis not present

## 2022-06-02 DIAGNOSIS — I700 Atherosclerosis of aorta: Secondary | ICD-10-CM | POA: Diagnosis not present

## 2022-06-02 DIAGNOSIS — J479 Bronchiectasis, uncomplicated: Secondary | ICD-10-CM | POA: Diagnosis not present

## 2022-06-02 DIAGNOSIS — I701 Atherosclerosis of renal artery: Secondary | ICD-10-CM | POA: Diagnosis not present

## 2022-06-02 DIAGNOSIS — N281 Cyst of kidney, acquired: Secondary | ICD-10-CM | POA: Diagnosis not present

## 2022-06-02 DIAGNOSIS — I739 Peripheral vascular disease, unspecified: Secondary | ICD-10-CM | POA: Diagnosis not present

## 2022-06-02 DIAGNOSIS — R9389 Abnormal findings on diagnostic imaging of other specified body structures: Secondary | ICD-10-CM | POA: Diagnosis not present

## 2022-06-02 DIAGNOSIS — J9811 Atelectasis: Secondary | ICD-10-CM | POA: Diagnosis not present

## 2022-06-02 DIAGNOSIS — K551 Chronic vascular disorders of intestine: Secondary | ICD-10-CM | POA: Diagnosis not present

## 2022-06-03 DIAGNOSIS — M79676 Pain in unspecified toe(s): Secondary | ICD-10-CM | POA: Diagnosis not present

## 2022-06-03 DIAGNOSIS — B351 Tinea unguium: Secondary | ICD-10-CM | POA: Diagnosis not present

## 2022-06-03 DIAGNOSIS — L84 Corns and callosities: Secondary | ICD-10-CM | POA: Diagnosis not present

## 2022-06-03 DIAGNOSIS — E1142 Type 2 diabetes mellitus with diabetic polyneuropathy: Secondary | ICD-10-CM | POA: Diagnosis not present

## 2022-07-22 DIAGNOSIS — E7800 Pure hypercholesterolemia, unspecified: Secondary | ICD-10-CM | POA: Diagnosis not present

## 2022-07-22 DIAGNOSIS — I10 Essential (primary) hypertension: Secondary | ICD-10-CM | POA: Diagnosis not present

## 2022-08-11 DIAGNOSIS — I10 Essential (primary) hypertension: Secondary | ICD-10-CM | POA: Diagnosis not present

## 2022-08-11 DIAGNOSIS — E1165 Type 2 diabetes mellitus with hyperglycemia: Secondary | ICD-10-CM | POA: Diagnosis not present

## 2022-08-11 DIAGNOSIS — I700 Atherosclerosis of aorta: Secondary | ICD-10-CM | POA: Diagnosis not present

## 2022-08-11 DIAGNOSIS — Z299 Encounter for prophylactic measures, unspecified: Secondary | ICD-10-CM | POA: Diagnosis not present

## 2022-08-11 DIAGNOSIS — I739 Peripheral vascular disease, unspecified: Secondary | ICD-10-CM | POA: Diagnosis not present

## 2022-08-11 DIAGNOSIS — Z6824 Body mass index (BMI) 24.0-24.9, adult: Secondary | ICD-10-CM | POA: Diagnosis not present

## 2022-09-23 DIAGNOSIS — L84 Corns and callosities: Secondary | ICD-10-CM | POA: Diagnosis not present

## 2022-09-23 DIAGNOSIS — B351 Tinea unguium: Secondary | ICD-10-CM | POA: Diagnosis not present

## 2022-09-23 DIAGNOSIS — M79676 Pain in unspecified toe(s): Secondary | ICD-10-CM | POA: Diagnosis not present

## 2022-09-23 DIAGNOSIS — E1142 Type 2 diabetes mellitus with diabetic polyneuropathy: Secondary | ICD-10-CM | POA: Diagnosis not present

## 2022-12-09 DIAGNOSIS — I739 Peripheral vascular disease, unspecified: Secondary | ICD-10-CM | POA: Diagnosis not present

## 2022-12-09 DIAGNOSIS — E7800 Pure hypercholesterolemia, unspecified: Secondary | ICD-10-CM | POA: Diagnosis not present

## 2022-12-09 DIAGNOSIS — Z299 Encounter for prophylactic measures, unspecified: Secondary | ICD-10-CM | POA: Diagnosis not present

## 2022-12-09 DIAGNOSIS — Z79899 Other long term (current) drug therapy: Secondary | ICD-10-CM | POA: Diagnosis not present

## 2022-12-09 DIAGNOSIS — Z0000 Encounter for general adult medical examination without abnormal findings: Secondary | ICD-10-CM | POA: Diagnosis not present

## 2022-12-09 DIAGNOSIS — R5383 Other fatigue: Secondary | ICD-10-CM | POA: Diagnosis not present

## 2022-12-09 DIAGNOSIS — I700 Atherosclerosis of aorta: Secondary | ICD-10-CM | POA: Diagnosis not present

## 2022-12-09 DIAGNOSIS — Z7189 Other specified counseling: Secondary | ICD-10-CM | POA: Diagnosis not present

## 2022-12-23 DIAGNOSIS — B351 Tinea unguium: Secondary | ICD-10-CM | POA: Diagnosis not present

## 2022-12-23 DIAGNOSIS — L84 Corns and callosities: Secondary | ICD-10-CM | POA: Diagnosis not present

## 2022-12-23 DIAGNOSIS — M79676 Pain in unspecified toe(s): Secondary | ICD-10-CM | POA: Diagnosis not present

## 2022-12-23 DIAGNOSIS — E1142 Type 2 diabetes mellitus with diabetic polyneuropathy: Secondary | ICD-10-CM | POA: Diagnosis not present

## 2022-12-30 DIAGNOSIS — E1142 Type 2 diabetes mellitus with diabetic polyneuropathy: Secondary | ICD-10-CM | POA: Diagnosis not present

## 2022-12-30 DIAGNOSIS — J309 Allergic rhinitis, unspecified: Secondary | ICD-10-CM | POA: Diagnosis not present

## 2022-12-30 DIAGNOSIS — E1165 Type 2 diabetes mellitus with hyperglycemia: Secondary | ICD-10-CM | POA: Diagnosis not present

## 2022-12-30 DIAGNOSIS — I10 Essential (primary) hypertension: Secondary | ICD-10-CM | POA: Diagnosis not present

## 2022-12-30 DIAGNOSIS — Z299 Encounter for prophylactic measures, unspecified: Secondary | ICD-10-CM | POA: Diagnosis not present

## 2023-02-07 DIAGNOSIS — M542 Cervicalgia: Secondary | ICD-10-CM | POA: Diagnosis not present

## 2023-02-07 DIAGNOSIS — R079 Chest pain, unspecified: Secondary | ICD-10-CM | POA: Diagnosis not present

## 2023-02-07 DIAGNOSIS — R519 Headache, unspecified: Secondary | ICD-10-CM | POA: Diagnosis not present

## 2023-02-07 DIAGNOSIS — Z888 Allergy status to other drugs, medicaments and biological substances status: Secondary | ICD-10-CM | POA: Diagnosis not present

## 2023-02-07 DIAGNOSIS — M26629 Arthralgia of temporomandibular joint, unspecified side: Secondary | ICD-10-CM | POA: Diagnosis not present

## 2023-02-07 DIAGNOSIS — Z7984 Long term (current) use of oral hypoglycemic drugs: Secondary | ICD-10-CM | POA: Diagnosis not present

## 2023-02-07 DIAGNOSIS — Z886 Allergy status to analgesic agent status: Secondary | ICD-10-CM | POA: Diagnosis not present

## 2023-02-07 DIAGNOSIS — Z880 Allergy status to penicillin: Secondary | ICD-10-CM | POA: Diagnosis not present

## 2023-02-13 DIAGNOSIS — R519 Headache, unspecified: Secondary | ICD-10-CM | POA: Diagnosis not present

## 2023-02-13 DIAGNOSIS — Z299 Encounter for prophylactic measures, unspecified: Secondary | ICD-10-CM | POA: Diagnosis not present

## 2023-02-13 DIAGNOSIS — I10 Essential (primary) hypertension: Secondary | ICD-10-CM | POA: Diagnosis not present

## 2023-02-13 DIAGNOSIS — Z6823 Body mass index (BMI) 23.0-23.9, adult: Secondary | ICD-10-CM | POA: Diagnosis not present

## 2023-03-16 DIAGNOSIS — Z299 Encounter for prophylactic measures, unspecified: Secondary | ICD-10-CM | POA: Diagnosis not present

## 2023-03-16 DIAGNOSIS — I10 Essential (primary) hypertension: Secondary | ICD-10-CM | POA: Diagnosis not present

## 2023-03-16 DIAGNOSIS — M542 Cervicalgia: Secondary | ICD-10-CM | POA: Diagnosis not present

## 2023-03-31 DIAGNOSIS — M79676 Pain in unspecified toe(s): Secondary | ICD-10-CM | POA: Diagnosis not present

## 2023-03-31 DIAGNOSIS — E1142 Type 2 diabetes mellitus with diabetic polyneuropathy: Secondary | ICD-10-CM | POA: Diagnosis not present

## 2023-03-31 DIAGNOSIS — B351 Tinea unguium: Secondary | ICD-10-CM | POA: Diagnosis not present

## 2023-03-31 DIAGNOSIS — L84 Corns and callosities: Secondary | ICD-10-CM | POA: Diagnosis not present

## 2023-04-07 DIAGNOSIS — I10 Essential (primary) hypertension: Secondary | ICD-10-CM | POA: Diagnosis not present

## 2023-04-07 DIAGNOSIS — Z299 Encounter for prophylactic measures, unspecified: Secondary | ICD-10-CM | POA: Diagnosis not present

## 2023-04-07 DIAGNOSIS — M542 Cervicalgia: Secondary | ICD-10-CM | POA: Diagnosis not present

## 2023-04-07 DIAGNOSIS — E1165 Type 2 diabetes mellitus with hyperglycemia: Secondary | ICD-10-CM | POA: Diagnosis not present

## 2023-04-09 ENCOUNTER — Other Ambulatory Visit: Payer: Self-pay | Admitting: Internal Medicine

## 2023-04-09 DIAGNOSIS — Z1231 Encounter for screening mammogram for malignant neoplasm of breast: Secondary | ICD-10-CM

## 2023-04-14 ENCOUNTER — Telehealth: Payer: Self-pay | Admitting: *Deleted

## 2023-04-14 NOTE — Progress Notes (Signed)
  Care Coordination   Note   04/14/2023 Name: Kristen Mccoy MRN: [id number] DOB: 07-30-1941  Kristen Mccoy is a 82 y.o. year old female who sees [name], MD for primary care. I reached out to Audelia Hives by phone today to offer care coordination services.  [name] was given information about Care Coordination services today including:   The Care Coordination services include support from the care team which includes your Nurse Coordinator, Clinical Social Worker, or Pharmacist.  The Care Coordination team is here to help remove barriers to the health concerns and goals most important to you. Care Coordination services are voluntary, and the patient may decline or stop services at any time by request to their care team member.   Care Coordination Consent Status: Patient agreed to services and verbal consent obtained.   Follow up plan:  Telephone appointment with care coordination team member scheduled for:  04/28/23  Encounter Outcome:  Pt. Scheduled  [facility] Coordination Care Guide  Direct Dial: [phone]

## 2023-04-28 ENCOUNTER — Ambulatory Visit: Payer: Self-pay | Admitting: *Deleted

## 2023-04-28 ENCOUNTER — Encounter: Payer: Self-pay | Admitting: *Deleted

## 2023-04-28 DIAGNOSIS — I10 Essential (primary) hypertension: Secondary | ICD-10-CM

## 2023-04-29 DIAGNOSIS — Z299 Encounter for prophylactic measures, unspecified: Secondary | ICD-10-CM | POA: Diagnosis not present

## 2023-04-29 DIAGNOSIS — I10 Essential (primary) hypertension: Secondary | ICD-10-CM | POA: Diagnosis not present

## 2023-04-29 DIAGNOSIS — U071 COVID-19: Secondary | ICD-10-CM | POA: Diagnosis not present

## 2023-04-29 DIAGNOSIS — J029 Acute pharyngitis, unspecified: Secondary | ICD-10-CM | POA: Diagnosis not present

## 2023-04-29 NOTE — Patient Outreach (Signed)
Care Coordination   Initial Visit Note   04/28/2023 Name: Kristen Mccoy MRN: [id number] DOB: 02-Apr-1941  Kristen Mccoy is a 82 y.o. year old female who sees [name], MD for primary care. I spoke with  Kristen Mccoy by phone today.  What matters to the patients health and wellness today?  Managing blood pressure, blood sugar, and getting a shower bench.   Goals Addressed             This Visit's Progress    Manage Blood Pressure       Care Coordination Goals: Patient will take medications as directed and report any negative side effects to provider  Patient will monitor and record blood pressure daily and as needed and will call PCP or specialist with any readings outside of recommended range Patient will keep all recommended follow-up appointments with PCP and specialists  Patient will take blood pressure log to PCP and specialty appointments for review Patient will follow a low sodium/DASH diet  Patient will reach out to RN Care Coordinator [phone] with any care coordination or resource needs       Manage Blood Sugar       Care Coordination Goals: Patient will follow-up with PCP 3 months or as recommended Patient will take medication as prescribed and reach out to provider with any negative side effects Patient will continue to monitor and record blood sugar 2 times per day and as needed with glucometer, and will call PCP with any readings outside of recommended range Patient will take blood sugar log and meter to provider visits for review Patient will follow a modified carbohydrate diet and decrease simple carbohydrates and sugars Patient will increase activity level as tolerated with an ultimate goal of at least 150 minutes of exercise per week Patient will check feet daily for sores, wounds, calluses, etc and will notify provider of any abnormal findings Patient will follow-up with podiatrist every 6 months or as needed Patient will reach out to RN Care  Coordinator [phone] with any care coordination or resource needs      Purchase Shower Chair       Care Coordination Goals: Patient will talk with PCP about a prescription for a shower bench that extends over the edge of the tub to aid in getting in and out of the shower Patient will review handout on shower benches mailed by RN Care Coordinator Patient will understand that insurance may not cover shower bench Patient will reach out to RN Care Coordinator [phone] with any resource or care coordination needs        SDOH assessments and interventions completed:  Yes  SDOH Interventions Today    Flowsheet Row Most Recent Value  SDOH Interventions   Food Insecurity Interventions Intervention Not Indicated  Housing Interventions Intervention Not Indicated  Transportation Interventions Intervention Not Indicated  Utilities Interventions Intervention Not Indicated  Financial Strain Interventions Intervention Not Indicated  [electric bill is high but so far she and her family have been able to keep up with it]        Care Coordination Interventions:  Yes, provided  Interventions Today    Flowsheet Row Most Recent Value  Chronic Disease   Chronic disease during today's visit Diabetes, Hypertension (HTN)  General Interventions   General Interventions Discussed/Reviewed General Interventions Discussed, General Interventions Reviewed, Annual Eye Exam, Durable Medical Equipment (DME), Labs, Annual Foot Exam, Doctor Visits, Community Resources  Labs Hgb A1c every 3 months  Doctor Visits Discussed/Reviewed Doctor  Visits Discussed, Doctor Visits Reviewed, Annual Wellness Visits, PCP, Specialist  Durable Medical Equipment (DME) Glucomoter, BP Cuff, Shower bench  [recommended to purchase shower bench. Can ask PCP for prescription to see if insurance will help cover. Blood sugar this morning 112  Blood Pressure 124/84]  PCP/Specialist Visits Compliance with follow-up visit  [follow up  with PCP and podiatrist at least every 6 months and have eye exam each year]  Exercise Interventions   Exercise Discussed/Reviewed Physical Activity, Exercise Discussed, Exercise Reviewed  Physical Activity Discussed/Reviewed Physical Activity Discussed, Physical Activity Reviewed  [encouraged physical activity with a goal of 150 minutes of moderate activity per week]  Education Interventions   Education Provided Provided Education, Provided Printed Education  [printed education bench shower chair]  Provided Verbal Education On Nutrition, When to see the doctor, Exercise, Medication, Labs, Blood Sugar Monitoring, Community Resources  [blood pressure monitoring]  Labs Reviewed Hgb A1c  Mental Health Interventions   Mental Health Discussed/Reviewed Refer to Social Work for resources  Refer to Social Work for resources regarding Other  [Needs help with dental resources]  Nutrition Interventions   Nutrition Discussed/Reviewed Nutrition Discussed, Nutrition Reviewed, Carbohydrate meal planning, Portion sizes, Decreasing sugar intake, Decreasing salt, Increasing proteins, Adding fruits and vegetables, Fluid intake  [3 meals per day with 30GM of CHO and up to 2 snacks per day with less than 15GM of CHO]  Pharmacy Interventions   Pharmacy Dicussed/Reviewed Pharmacy Topics Discussed, Medications and their functions, Pharmacy Topics Reviewed  [Taking medications regularly and as prescribed. Has payor benefit that covers cost of medication. Typically doesn't pay out-of-pocket for meds.]  Safety Interventions   Safety Discussed/Reviewed Safety Discussed, Safety Reviewed, Fall Risk, Home Safety  [ambulates without assistance]  Home Safety Assistive Devices, Need for home safety assessment  [recommended a shower bench so that she can get in and out of the shower easily. Currently taking a sink bath.]       Follow up plan: Follow up call scheduled for 05/18/23    Encounter Outcome:  Pt. Visit Completed    Demetrios Loll, BSN, RN-BC RN Care Coordinator [facility]  [facility] Direct Dial: [phone] Main #: [phone]

## 2023-05-05 ENCOUNTER — Ambulatory Visit: Payer: Self-pay

## 2023-05-05 NOTE — Patient Outreach (Signed)
  Care Coordination   Follow Up Visit Note   05/05/2023 Name: ALWINE WEIST MRN: [id number] DOB: 1941/05/10  ELOISE ASHER is a 82 y.o. year old female who sees [name], MD for primary care. I spoke with  Renee Pain Salah by phone today.  What matters to the patients health and wellness today?  Identify a dental provider that is within her network.    Goals Addressed             This Visit's Progress    Care Coordination Activities       Care Coordination Interventions: Discussed the patient would like a new partial and is unable to locate a dentist who will accept her insurance Performed chart review to note the patient is covered under [facility]. SW Is unable to locate updated copy in patients chart; patient reports this is the [facility] Advantage plan and not the DSNP Plan Reviewed the [facility] Provider Directly for [facility] Medicare Advantage Plans to note the only dentists who accept this plan are located in either [facility] or [employer] Determined the patient is not comfortable traveling to either location to receive dental services Discussed the patient used to have a different health plan and recently switched to [facility] provided on open enrollment and opportunity to switch health plans for the next plan year if desired Advised the patient she may engage with a [employer] Counselor who offers free consultations to review health plans. Reviewed the [facility] Counselor can help the patient compare plans to enroll in a plan that is best suited for her needs Mailed the patient information on [facility] Counseling and how to contact the [facility] office         SDOH assessments and interventions completed:  Yes  SDOH Interventions Today    Flowsheet Row Most Recent Value  SDOH Interventions   Food Insecurity Interventions Intervention Not Indicated  Housing Interventions Intervention Not Indicated   Transportation Interventions Intervention Not Indicated  Utilities Interventions Intervention Not Indicated        Care Coordination Interventions:  Yes, provided   Interventions Today    Flowsheet Row Most Recent Value  Chronic Disease   Chronic disease during today's visit Other, Diabetes  [Dental Resources]  General Interventions   General Interventions Discussed/Reviewed General Interventions Discussed  Education Interventions   Education Provided Provided Education  Provided Verbal Education On [employer], [employer]  [Reviewed locations of [employer] with the patient,  Education on [employer] Counseling to determine if alternative health plans are better suited for the pt needs]        Follow up plan: Follow up call scheduled for 8/23    Encounter Outcome:  Pt. Visit Completed   Bevelyn Ngo, Kenard Gower, CDP Social Worker, Certified Dementia Practitioner [facility] Care Management  Care Coordination [phone]

## 2023-05-05 NOTE — Patient Instructions (Signed)
Visit Information  Thank you for taking time to visit with me today. Please don't hesitate to contact me if I can be of assistance to you.   Following are the goals we discussed today:  - Review mailed resource information  Our next appointment is by telephone on 8/23 at 2:45 pm  Please call the care guide team at [phone] if you need to cancel or reschedule your appointment.   If you are experiencing a Mental Health or Behavioral Health Crisis or need someone to talk to, please call the Suicide and Crisis Lifeline: 988 call the [facility]: [phone] call 911  The patient verbalized understanding of instructions, educational materials, and care plan provided today and DECLINED offer to receive copy of patient instructions, educational materials, and care plan.   Bevelyn Ngo, BSW, CDP Social Worker, Certified Dementia Practitioner [facility] Care Management  Care Coordination [phone]

## 2023-05-12 DIAGNOSIS — E1165 Type 2 diabetes mellitus with hyperglycemia: Secondary | ICD-10-CM | POA: Diagnosis not present

## 2023-05-15 ENCOUNTER — Telehealth: Payer: Self-pay

## 2023-05-15 DIAGNOSIS — E559 Vitamin D deficiency, unspecified: Secondary | ICD-10-CM | POA: Diagnosis not present

## 2023-05-15 DIAGNOSIS — Z0000 Encounter for general adult medical examination without abnormal findings: Secondary | ICD-10-CM | POA: Diagnosis not present

## 2023-05-15 DIAGNOSIS — Z79899 Other long term (current) drug therapy: Secondary | ICD-10-CM | POA: Diagnosis not present

## 2023-05-15 DIAGNOSIS — E7800 Pure hypercholesterolemia, unspecified: Secondary | ICD-10-CM | POA: Diagnosis not present

## 2023-05-15 DIAGNOSIS — I10 Essential (primary) hypertension: Secondary | ICD-10-CM | POA: Diagnosis not present

## 2023-05-15 DIAGNOSIS — R5383 Other fatigue: Secondary | ICD-10-CM | POA: Diagnosis not present

## 2023-05-15 DIAGNOSIS — Z299 Encounter for prophylactic measures, unspecified: Secondary | ICD-10-CM | POA: Diagnosis not present

## 2023-05-15 NOTE — Patient Outreach (Signed)
  Care Coordination   05/15/2023 Name: Kristen Mccoy MRN: [id number] DOB: [birth year]   Care Coordination Outreach Attempts:  SW placed an unsuccessful outbound call to the patient to confirm receipt of mailed resources. SW left a HIPAA compliant voice message requesting a return call.  Follow Up Plan:  Additional outreach attempts will be made to offer the patient care coordination information and services.   Encounter Outcome:  No Answer   Care Coordination Interventions:  No, not indicated    Bevelyn Ngo, BSW, CDP Social Worker, Certified Dementia Practitioner [facility] Care Management  Care Coordination [phone]

## 2023-05-18 ENCOUNTER — Encounter: Payer: Self-pay | Admitting: *Deleted

## 2023-05-18 ENCOUNTER — Ambulatory Visit: Payer: Self-pay | Admitting: *Deleted

## 2023-05-18 NOTE — Patient Outreach (Signed)
Care Coordination   Follow Up Visit Note   05/18/2023 Name: Kristen Mccoy MRN: [id number] DOB: 1941/02/17  Kristen Mccoy is a 82 y.o. year old female who sees Kristen Peri, MD for primary care. I spoke with  Kristen Mccoy by phone today.  What matters to the patients health and wellness today?  Managing blood sugar    Goals Addressed             This Visit's Progress    Manage Blood Pressure   On track    Care Coordination Goals: Patient will continue to monitor and record blood pressure daily and will take log to provider visits for review Patient will remain physically active at least 150 minutes per week Patient will reach out to RN Care Coordinator [phone] with any care coordination or resource needs       Manage Blood Sugar   On track    Care Coordination Goals: Patient will take glimepiride as directed. Dose was cut in half  Either 1/2 of 4mg  tablet (old prescription) or 1 whole 2mg  tablet (new presription) daily Patient will schedule annual eye exam Patient will schedule a follow-up with podiatrist, Dr Kristen Mccoy, in October 2024 Patient will reach out to RN Care Coordinator with any resource or care coordination needs [phone] Chair   On track    Care Coordination Goals: Patient will talk with PCP about a prescription for a shower bench that extends over the edge of the tub to aid in getting in and out of the shower Patient will take picture of shower bench that was mailed to PCP visit  Patient understands that insurance may not cover shower bench Patient will reach out to RN Care Coordinator [phone] with any resource or care coordination needs        SDOH assessments and interventions completed:  Yes  SDOH Interventions Today    Flowsheet Row Most Recent Value  SDOH Interventions   Food Insecurity Interventions Intervention Not Indicated  Transportation Interventions Intervention Not Indicated  Physical Activity  Interventions Intervention Not Indicated        Care Coordination Interventions:  Yes, provided  Interventions Today    Flowsheet Row Most Recent Value  Chronic Disease   Chronic disease during today's visit Diabetes, Hypertension (HTN)  General Interventions   General Interventions Discussed/Reviewed General Interventions Discussed, General Interventions Reviewed, Doctor Visits, Vaccines, Durable Medical Equipment (DME), Labs, Annual Foot Exam, Annual Eye Exam  Labs Hgb A1c every 3 months  Vaccines Flu  Doctor Visits Discussed/Reviewed Doctor Visits Discussed, Doctor Visits Reviewed, Annual Wellness Visits, PCP  [discussed recent office visit with PCP]  Durable Medical Equipment (DME) Glucomoter, BP Cuff  [Today's Blood Sugar 139, Blood Pressure 130/64]  PCP/Specialist Visits Compliance with follow-up visit  [Keep appointment with PCP on 05/22/23. Schedule appointment with Dr Kristen Mccoy (podiatry) for 06/2023]  Exercise Interventions   Exercise Discussed/Reviewed Exercise Discussed, Exercise Reviewed, Physical Activity  Physical Activity Discussed/Reviewed Physical Activity Discussed, Physical Activity Reviewed, Types of exercise  [patient walks at the [facility] for 60 minutes each morning. Provided praise and encouragement.]  Education Interventions   Education Provided Provided Education  Provided Verbal Education On Foot Care, Nutrition, Labs, When to see the doctor, Blood Sugar Monitoring, Exercise, Medication  Labs Reviewed Hgb A1c  [04/07/23 A1C 7.6%]  Nutrition Interventions   Nutrition Discussed/Reviewed Nutrition Discussed, Nutrition Reviewed  [Patient is following a generally healthy diet and limiting simple carbohydrates and sugars. Encouraged  3 meals per day with around 30 GM of CHO each.]  Pharmacy Interventions   Pharmacy Dicussed/Reviewed Pharmacy Topics Discussed, Pharmacy Topics Reviewed, Medications and their functions  [reviewed and reconiled [facility] medication list with pharmacy  records. Glimepiride was cut in half from 4mg  to 2mg  at last PCP appointment because blood sugar was averaging around 98 fasting. Pt can take 1/2 tablet of 4mg  dose or 1 whole tablet of 2mg  dose.]  Safety Interventions   Safety Discussed/Reviewed Safety Discussed, Safety Reviewed, Home Safety  [ambulates well without assistance. Discussed potential dangers of hypoglcemia. Patient is monitoring blood sugar and hasn't had any readings less than 70. Medication has been adjusted.]  Home Safety Assistive Devices  [Again recommended a shower bench seat that extends over the edge of the tub so she doesn't have to step over into the tub]      Follow up plan: Follow up call scheduled for 06/17/23    Encounter Outcome:  Pt. Visit Completed   Kristen Mccoy, BSN, RN-BC RN Care Coordinator [facility]  [facility] Direct Dial: [phone] Main #: [phone]

## 2023-05-20 ENCOUNTER — Ambulatory Visit: Payer: Self-pay

## 2023-05-20 NOTE — Patient Instructions (Signed)
Visit Information  Thank you for taking time to visit with me today. Please don't hesitate to contact me if I can be of assistance to you.   Following are the goals we discussed today:  - Contact a [facility] counselor during Medicare open enrollment as desired regarding health plan coverage  Your next appointment is by telephone on 9/25 at 1:00 with RN Care Manager Demetrios Loll  Please call the care guide team at [phone] if you need to cancel or reschedule your appointment.   If you are experiencing a Mental Health or Behavioral Health Crisis or need someone to talk to, please call 1-800-273-TALK (toll free, 24 hour hotline) call the [facility]: [phone] call 911  The patient verbalized understanding of instructions, educational materials, and care plan provided today and DECLINED offer to receive copy of patient instructions, educational materials, and care plan.   No further follow up required: Please contact me as needed.  Bevelyn Ngo, BSW, CDP Social Worker, Certified Dementia Practitioner [facility] Care Management  Care Coordination [phone]

## 2023-05-20 NOTE — Patient Outreach (Signed)
  Care Coordination   Follow Up Visit Note   05/20/2023 Name: CALEENA RUSHLOW MRN: [id number] DOB: 06-18-1941  BRESEIS DELMAN is a 82 y.o. year old female who sees [name], MD for primary care. I spoke with  Renee Pain Briceno by phone today.  What matters to the patients health and wellness today?  No concerns during today's call, patient reports she is doing well.    Goals Addressed             This Visit's Progress    COMPLETED: Care Coordination Activities       Care Coordination Interventions: Confirmed receipt of mailed resource; patient is aware she may choose to switch health plans if desired during open enrollment. Patient understands she may contact a Furniture conservator/restorer for assistance with comparing health plan benefit Assessed for acute SW needs - patient declines the need for further SW assistance Encouraged the patient to contact SW as needed        SDOH assessments and interventions completed:  No     Care Coordination Interventions:  Yes, provided   Interventions Today    Flowsheet Row Most Recent Value  Chronic Disease   Chronic disease during today's visit Diabetes, Hypertension (HTN)  General Interventions   General Interventions Discussed/Reviewed General Interventions Reviewed  [Confirmed receipt of mailed resource,  no other SW needs identified at this time]        Follow up plan: No further intervention required.   Encounter Outcome:  Pt. Visit Completed   Bevelyn Ngo, BSW, CDP Social Worker, Certified Dementia Practitioner [facility] Care Management  Care Coordination [phone]

## 2023-05-22 DIAGNOSIS — Z299 Encounter for prophylactic measures, unspecified: Secondary | ICD-10-CM | POA: Diagnosis not present

## 2023-05-22 DIAGNOSIS — I10 Essential (primary) hypertension: Secondary | ICD-10-CM | POA: Diagnosis not present

## 2023-05-22 DIAGNOSIS — H6121 Impacted cerumen, right ear: Secondary | ICD-10-CM | POA: Diagnosis not present

## 2023-05-26 DIAGNOSIS — H524 Presbyopia: Secondary | ICD-10-CM | POA: Diagnosis not present

## 2023-06-10 DIAGNOSIS — M79642 Pain in left hand: Secondary | ICD-10-CM | POA: Diagnosis not present

## 2023-06-10 DIAGNOSIS — Z6824 Body mass index (BMI) 24.0-24.9, adult: Secondary | ICD-10-CM | POA: Diagnosis not present

## 2023-06-10 DIAGNOSIS — S62647A Nondisplaced fracture of proximal phalanx of left little finger, initial encounter for closed fracture: Secondary | ICD-10-CM | POA: Diagnosis not present

## 2023-06-11 DIAGNOSIS — E1165 Type 2 diabetes mellitus with hyperglycemia: Secondary | ICD-10-CM | POA: Diagnosis not present

## 2023-06-17 ENCOUNTER — Ambulatory Visit: Payer: Self-pay | Admitting: *Deleted

## 2023-06-17 DIAGNOSIS — H4010X0 Unspecified open-angle glaucoma, stage unspecified: Secondary | ICD-10-CM | POA: Diagnosis not present

## 2023-06-17 DIAGNOSIS — E785 Hyperlipidemia, unspecified: Secondary | ICD-10-CM | POA: Diagnosis not present

## 2023-06-17 DIAGNOSIS — Z008 Encounter for other general examination: Secondary | ICD-10-CM | POA: Diagnosis not present

## 2023-06-17 DIAGNOSIS — Z6823 Body mass index (BMI) 23.0-23.9, adult: Secondary | ICD-10-CM | POA: Diagnosis not present

## 2023-06-17 DIAGNOSIS — E1122 Type 2 diabetes mellitus with diabetic chronic kidney disease: Secondary | ICD-10-CM | POA: Diagnosis not present

## 2023-06-17 DIAGNOSIS — I129 Hypertensive chronic kidney disease with stage 1 through stage 4 chronic kidney disease, or unspecified chronic kidney disease: Secondary | ICD-10-CM | POA: Diagnosis not present

## 2023-06-17 DIAGNOSIS — N182 Chronic kidney disease, stage 2 (mild): Secondary | ICD-10-CM | POA: Diagnosis not present

## 2023-06-17 DIAGNOSIS — E1169 Type 2 diabetes mellitus with other specified complication: Secondary | ICD-10-CM | POA: Diagnosis not present

## 2023-06-17 NOTE — Patient Outreach (Signed)
Care Coordination   06/17/2023 Name: Kristen Mccoy MRN: [id number] DOB: 1940-11-23   Care Coordination Outreach Attempts:  An unsuccessful telephone outreach was attempted for a scheduled appointment today.  Follow Up Plan:  Additional outreach attempts will be made to offer the patient care coordination information and services.   Encounter Outcome:  No Answer. Left HIPAA compliant VM.   Care Coordination Interventions:  No, not indicated    Demetrios Loll, RN, BSN Care Management Coordinator [facility]  [facility] Direct Dial: [phone] Main #: [phone]

## 2023-06-23 ENCOUNTER — Telehealth: Payer: Self-pay | Admitting: *Deleted

## 2023-06-23 NOTE — Progress Notes (Signed)
Care Coordination Note  06/23/2023 Name: NEEVE FREIHEIT MRN: [id number] DOB: 1941/07/08  BRYLAN FEBLES is a 82 y.o. year old female who is a primary care patient of [name], MD and is actively engaged with the care management team. I reached out to Audelia Hives by phone today to assist with re-scheduling a follow up visit with the RN Case Manager  Follow up plan: Unsuccessful telephone outreach attempt made. A HIPAA compliant phone message was left for the patient providing contact information and requesting a return call.   [facility]  Care Coordination Care Guide  Direct Dial: [phone]

## 2023-06-30 DIAGNOSIS — B351 Tinea unguium: Secondary | ICD-10-CM | POA: Diagnosis not present

## 2023-06-30 DIAGNOSIS — L84 Corns and callosities: Secondary | ICD-10-CM | POA: Diagnosis not present

## 2023-06-30 DIAGNOSIS — E1142 Type 2 diabetes mellitus with diabetic polyneuropathy: Secondary | ICD-10-CM | POA: Diagnosis not present

## 2023-06-30 DIAGNOSIS — M79676 Pain in unspecified toe(s): Secondary | ICD-10-CM | POA: Diagnosis not present

## 2023-06-30 NOTE — Progress Notes (Signed)
Care Coordination Note  06/30/2023 Name: Kristen Mccoy MRN: [id number] DOB: [birth year]  Kristen Mccoy is a 82 y.o. year old female who is a primary care patient of [name], MD and is ok engaged with the care management team. I reached out to Audelia Hives by phone today to assist with re-scheduling a follow up visit with the RN Case Manager  Follow up plan: Telephone appointment with care management team member scheduled for: 10/29  [facility]  Care Coordination Care Guide  Direct Dial: [phone]

## 2023-07-11 DIAGNOSIS — E1165 Type 2 diabetes mellitus with hyperglycemia: Secondary | ICD-10-CM | POA: Diagnosis not present

## 2023-07-16 DIAGNOSIS — E1169 Type 2 diabetes mellitus with other specified complication: Secondary | ICD-10-CM | POA: Diagnosis not present

## 2023-07-16 DIAGNOSIS — R32 Unspecified urinary incontinence: Secondary | ICD-10-CM | POA: Diagnosis not present

## 2023-07-16 DIAGNOSIS — I10 Essential (primary) hypertension: Secondary | ICD-10-CM | POA: Diagnosis not present

## 2023-07-16 DIAGNOSIS — Z299 Encounter for prophylactic measures, unspecified: Secondary | ICD-10-CM | POA: Diagnosis not present

## 2023-07-21 ENCOUNTER — Ambulatory Visit: Payer: Self-pay | Admitting: *Deleted

## 2023-07-21 NOTE — Patient Outreach (Signed)
Care Coordination   07/21/2023 Name: Kristen Mccoy MRN: [id number] DOB: 1941/09/22   Care Coordination Outreach Attempts:  An unsuccessful telephone outreach was attempted for a scheduled appointment today.  Follow Up Plan:  Additional outreach attempts will be made to offer the patient care coordination information and services.   Encounter Outcome:  No Answer. Left HIPAA compliant VM.   Care Coordination Interventions:  No, not indicated. Staff message sent to scheduling care guide requesting outreach and rescheduling.    Demetrios Loll, RN, BSN Care Management Coordinator [facility]  [facility] Direct Dial: [phone] Main #: [phone]

## 2023-08-04 ENCOUNTER — Ambulatory Visit: Payer: Self-pay | Admitting: *Deleted

## 2023-08-04 ENCOUNTER — Encounter: Payer: Self-pay | Admitting: *Deleted

## 2023-08-04 NOTE — Patient Outreach (Signed)
Care Coordination   Follow Up Visit Note   08/04/2023 Name: Kristen Mccoy MRN: [id number] DOB: 08/22/1941  Kristen Mccoy is a 82 y.o. year old female who sees Kristen Peri, MD for primary care. I spoke with  Kristen Mccoy by phone today.  What matters to the patients health and wellness today?  Ongoing management of blood pressure and blood sugar.    Goals Addressed             This Visit's Progress    COMPLETED: Manage Blood Pressure       Care Coordination Goals: Patient will continue to monitor and record blood pressure daily and will take log to provider visits for review Patient will remain physically active at least 150 minutes per week Patient will reach out to RN Care Coordinator [phone] with any care coordination or resource needs       COMPLETED: Manage Blood Sugar       Care Coordination Goals: Patient will continue to take medications as prescribed Patient will continue to follow-up with PCP as planned Patient will monitor blood sugar and reach out to PCP with any readings outside of recommended range Patient will reach out to RN Care Coordinator with any resource or care coordination needs [phone]     COMPLETED: Insurance account manager Chair       Care Coordination Goals: Patient will talk with PCP about a prescription for a shower bench that extends over the edge of the tub to aid in getting in and out of the shower Patient will take picture of shower bench that was mailed to PCP visit  Patient understands that insurance may not cover shower bench Patient will reach out to RN Care Coordinator [phone] with any resource or care coordination needs        SDOH assessments and interventions completed:  Yes SDOH Interventions Today    Flowsheet Row Most Recent Value  SDOH Interventions   Housing Interventions Intervention Not Indicated  Transportation Interventions Intervention Not Indicated  Physical Activity Interventions Intervention Not  Indicated       Care Coordination Interventions:  Yes, provided  Interventions Today    Flowsheet Row Most Recent Value  Chronic Disease   Chronic disease during today's visit Diabetes, Hypertension (HTN)  General Interventions   General Interventions Discussed/Reviewed General Interventions Discussed, General Interventions Reviewed, Annual Foot Exam, Labs, Annual Eye Exam, Durable Medical Equipment (DME), Doctor Visits  Labs Hgb A1c every 3 months  Doctor Visits Discussed/Reviewed Doctor Visits Discussed, Doctor Visits Reviewed, PCP, Annual Wellness Visits, Specialist  [Discussed recent PCP, podiatry, and opthalmology visits. Per patient, eye exam was negative for retinopathy. Will buy glasses in January. Had toenails trimmed and foot exam in October.]  Durable Medical Equipment (DME) BP Cuff, Glucomoter  [reports that blood sugar and blood pressure readings are good. Did not have home readings available for review.]  PCP/Specialist Visits Compliance with follow-up visit  [Keep follow-up appointment with PCP]  Exercise Interventions   Exercise Discussed/Reviewed Exercise Discussed, Exercise Reviewed, Physical Activity  Physical Activity Discussed/Reviewed Physical Activity Discussed, Physical Activity Reviewed  [able to perform ADLs independently. Encouraged to remain active with a goal of 150 minutes per week]  Education Interventions   Education Provided Provided Education  Provided Verbal Education On Nutrition, Foot Care, Eye Care, Labs, Blood Sugar Monitoring, When to see the doctor, Medication, Other  [blood pressure monitoring]  Labs Reviewed Hgb A1c  [patient reports A1C of 6.9% at PCP visit 3 weeks ago. Results  were not available in KPN tool for review.]  Nutrition Interventions   Nutrition Discussed/Reviewed Nutrition Discussed, Nutrition Reviewed, Carbohydrate meal planning, Portion sizes, Adding fruits and vegetables, Fluid intake, Increasing proteins, Decreasing sugar intake   [Eat 3 meals per day with 30 GM of CHO and up to 2 snacks per day, if needed, with less than 15 GM of CHO]  Pharmacy Interventions   Pharmacy Dicussed/Reviewed Pharmacy Topics Discussed, Pharmacy Topics Reviewed, Medications and their functions  [patient is taking medications as directed. No questions or concerns.]  Safety Interventions   Safety Discussed/Reviewed Safety Discussed, Safety Reviewed, Fall Risk, Home Safety  Advanced Directive Interventions   Advanced Directives Discussed/Reviewed Advanced Directives Discussed, Advanced Care Planning  [encouraged to consider]       Follow up plan: No further intervention required. Patient provided with RN Care Manager's contact number and encouraged to reach out if needed.  Encounter Outcome:  Patient Visit Completed   Demetrios Loll, RN, BSN Care Management Coordinator [facility]  [facility] Direct Dial: [phone] Main #: [phone]

## 2023-10-16 DIAGNOSIS — I739 Peripheral vascular disease, unspecified: Secondary | ICD-10-CM | POA: Diagnosis not present

## 2023-10-16 DIAGNOSIS — E1142 Type 2 diabetes mellitus with diabetic polyneuropathy: Secondary | ICD-10-CM | POA: Diagnosis not present

## 2023-10-16 DIAGNOSIS — I700 Atherosclerosis of aorta: Secondary | ICD-10-CM | POA: Diagnosis not present

## 2023-10-16 DIAGNOSIS — I10 Essential (primary) hypertension: Secondary | ICD-10-CM | POA: Diagnosis not present

## 2023-10-16 DIAGNOSIS — E1165 Type 2 diabetes mellitus with hyperglycemia: Secondary | ICD-10-CM | POA: Diagnosis not present

## 2023-10-16 DIAGNOSIS — Z299 Encounter for prophylactic measures, unspecified: Secondary | ICD-10-CM | POA: Diagnosis not present

## 2023-11-07 LAB — AMB RESULTS CONSOLE CBG: Glucose: 235

## 2024-03-07 DIAGNOSIS — E1065 Type 1 diabetes mellitus with hyperglycemia: Secondary | ICD-10-CM | POA: Diagnosis not present

## 2024-04-04 DIAGNOSIS — Z299 Encounter for prophylactic measures, unspecified: Secondary | ICD-10-CM | POA: Diagnosis not present

## 2024-04-04 DIAGNOSIS — I10 Essential (primary) hypertension: Secondary | ICD-10-CM | POA: Diagnosis not present

## 2024-04-04 DIAGNOSIS — R52 Pain, unspecified: Secondary | ICD-10-CM | POA: Diagnosis not present

## 2024-04-04 DIAGNOSIS — R600 Localized edema: Secondary | ICD-10-CM | POA: Diagnosis not present

## 2024-04-04 DIAGNOSIS — M10041 Idiopathic gout, right hand: Secondary | ICD-10-CM | POA: Diagnosis not present

## 2024-05-20 DIAGNOSIS — Z0000 Encounter for general adult medical examination without abnormal findings: Secondary | ICD-10-CM | POA: Diagnosis not present

## 2024-05-20 DIAGNOSIS — R350 Frequency of micturition: Secondary | ICD-10-CM | POA: Diagnosis not present

## 2024-05-20 DIAGNOSIS — E559 Vitamin D deficiency, unspecified: Secondary | ICD-10-CM | POA: Diagnosis not present

## 2024-05-20 DIAGNOSIS — E7800 Pure hypercholesterolemia, unspecified: Secondary | ICD-10-CM | POA: Diagnosis not present

## 2024-05-20 DIAGNOSIS — E119 Type 2 diabetes mellitus without complications: Secondary | ICD-10-CM | POA: Diagnosis not present

## 2024-05-20 DIAGNOSIS — I10 Essential (primary) hypertension: Secondary | ICD-10-CM | POA: Diagnosis not present

## 2024-05-20 DIAGNOSIS — Z79899 Other long term (current) drug therapy: Secondary | ICD-10-CM | POA: Diagnosis not present

## 2024-05-20 DIAGNOSIS — Z299 Encounter for prophylactic measures, unspecified: Secondary | ICD-10-CM | POA: Diagnosis not present

## 2024-05-20 DIAGNOSIS — R5383 Other fatigue: Secondary | ICD-10-CM | POA: Diagnosis not present

## 2024-06-06 DIAGNOSIS — E1065 Type 1 diabetes mellitus with hyperglycemia: Secondary | ICD-10-CM | POA: Diagnosis not present

## 2024-06-13 DIAGNOSIS — Z299 Encounter for prophylactic measures, unspecified: Secondary | ICD-10-CM | POA: Diagnosis not present

## 2024-06-13 DIAGNOSIS — I10 Essential (primary) hypertension: Secondary | ICD-10-CM | POA: Diagnosis not present

## 2024-06-13 DIAGNOSIS — R350 Frequency of micturition: Secondary | ICD-10-CM | POA: Diagnosis not present

## 2024-06-13 DIAGNOSIS — N390 Urinary tract infection, site not specified: Secondary | ICD-10-CM | POA: Diagnosis not present

## 2024-06-15 DIAGNOSIS — Z79899 Other long term (current) drug therapy: Secondary | ICD-10-CM | POA: Diagnosis not present

## 2024-06-15 DIAGNOSIS — H9209 Otalgia, unspecified ear: Secondary | ICD-10-CM | POA: Diagnosis not present

## 2024-06-15 DIAGNOSIS — R519 Headache, unspecified: Secondary | ICD-10-CM | POA: Diagnosis not present

## 2024-06-15 DIAGNOSIS — E119 Type 2 diabetes mellitus without complications: Secondary | ICD-10-CM | POA: Diagnosis not present

## 2024-06-15 DIAGNOSIS — Z888 Allergy status to other drugs, medicaments and biological substances status: Secondary | ICD-10-CM | POA: Diagnosis not present

## 2024-06-15 DIAGNOSIS — Z7984 Long term (current) use of oral hypoglycemic drugs: Secondary | ICD-10-CM | POA: Diagnosis not present

## 2024-06-15 DIAGNOSIS — R6884 Jaw pain: Secondary | ICD-10-CM | POA: Diagnosis not present

## 2024-06-15 DIAGNOSIS — Z880 Allergy status to penicillin: Secondary | ICD-10-CM | POA: Diagnosis not present

## 2024-06-15 DIAGNOSIS — Z886 Allergy status to analgesic agent status: Secondary | ICD-10-CM | POA: Diagnosis not present

## 2024-06-16 DIAGNOSIS — R6884 Jaw pain: Secondary | ICD-10-CM | POA: Diagnosis not present

## 2024-06-16 DIAGNOSIS — Z9049 Acquired absence of other specified parts of digestive tract: Secondary | ICD-10-CM | POA: Diagnosis not present

## 2024-06-16 DIAGNOSIS — R079 Chest pain, unspecified: Secondary | ICD-10-CM | POA: Diagnosis not present

## 2024-06-16 DIAGNOSIS — R42 Dizziness and giddiness: Secondary | ICD-10-CM | POA: Diagnosis not present

## 2024-06-16 DIAGNOSIS — Z7984 Long term (current) use of oral hypoglycemic drugs: Secondary | ICD-10-CM | POA: Diagnosis not present

## 2024-06-16 DIAGNOSIS — Z880 Allergy status to penicillin: Secondary | ICD-10-CM | POA: Diagnosis not present

## 2024-06-16 DIAGNOSIS — H9209 Otalgia, unspecified ear: Secondary | ICD-10-CM | POA: Diagnosis not present

## 2024-06-16 DIAGNOSIS — I672 Cerebral atherosclerosis: Secondary | ICD-10-CM | POA: Diagnosis not present

## 2024-06-16 DIAGNOSIS — E119 Type 2 diabetes mellitus without complications: Secondary | ICD-10-CM | POA: Diagnosis not present

## 2024-06-16 DIAGNOSIS — E860 Dehydration: Secondary | ICD-10-CM | POA: Diagnosis not present

## 2024-06-16 DIAGNOSIS — R531 Weakness: Secondary | ICD-10-CM | POA: Diagnosis not present
